# Patient Record
Sex: Female | Born: 1995 | Hispanic: No | Marital: Single | Smoking: Never smoker
Health system: Southern US, Community
[De-identification: ages and names within clinical notes are randomized; demographics above are authoritative.]

---
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
## 2019-02-05 ENCOUNTER — Other Ambulatory Visit: Payer: Self-pay

## 2019-02-05 DIAGNOSIS — Z20822 Contact with and (suspected) exposure to covid-19: Secondary | ICD-10-CM

## 2019-02-06 LAB — NOVEL CORONAVIRUS, NAA: SARS-CoV-2, NAA: NOT DETECTED

## 2019-02-12 ENCOUNTER — Ambulatory Visit: Payer: HRSA Program | Attending: Internal Medicine

## 2019-02-12 DIAGNOSIS — Z20822 Contact with and (suspected) exposure to covid-19: Secondary | ICD-10-CM

## 2019-02-12 DIAGNOSIS — Z20828 Contact with and (suspected) exposure to other viral communicable diseases: Secondary | ICD-10-CM | POA: Insufficient documentation

## 2019-02-14 LAB — NOVEL CORONAVIRUS, NAA: SARS-CoV-2, NAA: NOT DETECTED

## 2019-02-17 ENCOUNTER — Other Ambulatory Visit: Payer: Self-pay

## 2019-12-13 ENCOUNTER — Ambulatory Visit
Admission: RE | Admit: 2019-12-13 | Discharge: 2019-12-13 | Disposition: A | Discharge status: Home / Self Care | Payer: Managed Care, Other (non HMO) | Source: Ambulatory Visit | Attending: Family Medicine | Admitting: Family Medicine

## 2019-12-13 ENCOUNTER — Other Ambulatory Visit: Payer: Self-pay | Admitting: Family Medicine

## 2019-12-13 ENCOUNTER — Other Ambulatory Visit: Payer: Self-pay

## 2019-12-13 DIAGNOSIS — M7989 Other specified soft tissue disorders: Secondary | ICD-10-CM | POA: Diagnosis not present

## 2020-04-14 ENCOUNTER — Other Ambulatory Visit: Payer: Self-pay | Admitting: Physician Assistant

## 2020-04-14 DIAGNOSIS — R102 Pelvic and perineal pain: Secondary | ICD-10-CM

## 2020-04-21 ENCOUNTER — Other Ambulatory Visit: Payer: Self-pay

## 2020-04-21 ENCOUNTER — Ambulatory Visit: Payer: Managed Care, Other (non HMO)

## 2020-04-21 ENCOUNTER — Ambulatory Visit
Admission: RE | Admit: 2020-04-21 | Discharge: 2020-04-21 | Disposition: A | Discharge status: Home / Self Care | Payer: Managed Care, Other (non HMO) | Source: Ambulatory Visit | Attending: Physician Assistant | Admitting: Physician Assistant

## 2020-04-21 DIAGNOSIS — R102 Pelvic and perineal pain: Secondary | ICD-10-CM | POA: Insufficient documentation

## 2021-02-15 ENCOUNTER — Other Ambulatory Visit: Payer: Self-pay

## 2021-02-15 DIAGNOSIS — Z021 Encounter for pre-employment examination: Secondary | ICD-10-CM

## 2021-02-15 NOTE — Progress Notes (Signed)
Presents to [employer] for onsite pre-employment drug screen.  [facility] Acct #:  [id number] [facility] Specimen #:  [id number]  Rapid Drug Screen Results = Negative  AMD

## 2021-06-07 ENCOUNTER — Ambulatory Visit: Payer: Self-pay | Admitting: Physician Assistant

## 2021-06-07 ENCOUNTER — Encounter: Payer: Self-pay | Admitting: Physician Assistant

## 2021-06-07 VITALS — BP 111/69 | HR 88 | Temp 98.7°F | Resp 14 | Ht 59.0 in | Wt 125.0 lb

## 2021-06-07 DIAGNOSIS — L089 Local infection of the skin and subcutaneous tissue, unspecified: Secondary | ICD-10-CM

## 2021-06-07 MED ORDER — SULFAMETHOXAZOLE-TRIMETHOPRIM 800-160 MG PO TABS: 1.0000 | ORAL_TABLET | Freq: Two times a day (BID) | ORAL | 0 refills | Status: DC

## 2021-06-07 MED ORDER — NAPROXEN 500 MG PO TABS: 500.0000 mg | ORAL_TABLET | Freq: Two times a day (BID) | ORAL | Status: DC

## 2021-06-07 NOTE — Progress Notes (Signed)
Pt cut Left index finger with glass Saturday 06-02-21. Pt concerned of swelling and if she hits her finger she gets a numbing/tingling feeling. ?

## 2021-06-07 NOTE — Progress Notes (Signed)
? ?  Subjective: Left index finger infection  ? ? Patient ID: Pamela Ramos, female    DOB: 05-05-1995, 26 y.o.   MRN: [id number] ? ?HPI ?Patient presents for laceration status post glass cut on 06/02/2021.  Patient states she is cleaning the area with hydroperoxide and applying over-the-counter antibacterial ointments.  Patient states finger continues to have swollen and redness.  Denies loss sensation or loss of function. ? ? ?Review of Systems ?Negative except for chief complaint ?   ?Objective:  ? Physical Exam ? ?Temperature is 98.7, respiration 14, pulse 88, BP 111/69, and patient 1% O2 sat on room air.  Patient weighs 125 pounds and BMI is 25.25.  Examination of the left index finger reveals laceration at the MPJ palmar aspect of left hand.  Mild edema and erythema.  No drainage.  Full and equal range of motion.  Neurovascular intact. ? ? ?   ?Assessment & Plan: Infected finger  ? ?Patient given discharge care instructions and prescription for Bactrim DS and naproxen.  Patient advised to discontinue hydrogen peroxide and use warm soap and water for cleaning.  Advised to keep wound bandaged at work but exposed to air prior to going to bed.  Follow-up 1 week if no improvement. ?

## 2021-06-10 ENCOUNTER — Encounter: Payer: Self-pay | Admitting: Physician Assistant

## 2021-06-11 ENCOUNTER — Encounter: Payer: Self-pay | Admitting: Physician Assistant

## 2021-06-11 ENCOUNTER — Ambulatory Visit: Payer: Self-pay | Admitting: Physician Assistant

## 2021-06-11 VITALS — BP 106/60 | HR 86 | Temp 98.4°F | Resp 16 | Wt 136.0 lb

## 2021-06-11 DIAGNOSIS — L231 Allergic contact dermatitis due to adhesives: Secondary | ICD-10-CM

## 2021-06-11 MED ORDER — HYDROXYZINE PAMOATE 25 MG PO CAPS: 25.0000 mg | ORAL_CAPSULE | Freq: Three times a day (TID) | ORAL | 0 refills | Status: DC | PRN

## 2021-06-11 MED ORDER — HYDROCORTISONE VALERATE 0.2 % EX CREA: TOPICAL_CREAM | Freq: Two times a day (BID) | CUTANEOUS | Status: DC

## 2021-06-11 NOTE — Progress Notes (Signed)
? ?  Subjective: Rash  ? ? Patient ID: Pamela Ramos, female    DOB: 03-21-1995, 26 y.o.   MRN: [id number] ? ?HPI ?Patient presents for rash on the right index finger.  Patient previously treated for infected glass cut of the affected finger x1 adhesive dressing site radiating to the distal finger.  Associated mild itching.  Patient states she keeps the finger bandage because she works around morning and did not want to give further infection. ? ? ?Review of Systems ?Negative except for complaint ?   ?Objective:  ? Physical Exam ? ?Temperature is 98.4, respiration 16, pulse 86, BP is 106/60, and patient 1% O2 sat on room air.  Patient was 136 pounds and BMI is 27.47. ?Examination of the finger shows dorsal laceration healed by granulation.  Vesicle lesions are on the dorsal aspect where the adhesive bandage is applied.  No rash on the laceration site.  Neurovascular intact with free Nikkel range of motion.  No signs of infection. ? ? ?   ?Assessment & Plan: Contact dermatitis  ?Patient advised to discontinue use of adhesive bandages.  Advised to wear a glove until healing is complete.  Patient given a prescription for Westcort and Atarax.  Follow-up if no improvement or worsening complaint. ? ?

## 2021-06-11 NOTE — Progress Notes (Signed)
Laceration to finger - was seen in the clinic last Thursday & prescribed ABX ? ?Thinks she's allergic to the ABX because she's getting bumps on her hands & arms. ?D/C'd the ABX - Took Med Thurs, Fri, Sat & Sun - Stopped it this morning. ? ?AMD ? ?

## 2021-06-29 DIAGNOSIS — Z1389 Encounter for screening for other disorder: Secondary | ICD-10-CM | POA: Diagnosis not present

## 2021-06-29 DIAGNOSIS — Z3200 Encounter for pregnancy test, result unknown: Secondary | ICD-10-CM | POA: Diagnosis not present

## 2021-06-29 DIAGNOSIS — R102 Pelvic and perineal pain: Secondary | ICD-10-CM | POA: Diagnosis not present

## 2021-07-11 DIAGNOSIS — Z3149 Encounter for other procreative investigation and testing: Secondary | ICD-10-CM | POA: Diagnosis not present

## 2021-07-16 ENCOUNTER — Other Ambulatory Visit: Payer: Self-pay | Admitting: Obstetrics and Gynecology

## 2021-07-16 DIAGNOSIS — Z3149 Encounter for other procreative investigation and testing: Secondary | ICD-10-CM

## 2021-07-20 ENCOUNTER — Encounter: Payer: Self-pay | Admitting: Obstetrics and Gynecology

## 2021-07-20 ENCOUNTER — Ambulatory Visit
Admission: RE | Admit: 2021-07-20 | Discharge: 2021-07-20 | Disposition: A | Discharge status: Home / Self Care | Payer: 59 | Source: Ambulatory Visit | Attending: Obstetrics and Gynecology | Admitting: Obstetrics and Gynecology

## 2021-07-20 DIAGNOSIS — N979 Female infertility, unspecified: Secondary | ICD-10-CM | POA: Diagnosis not present

## 2021-07-20 DIAGNOSIS — Z3149 Encounter for other procreative investigation and testing: Secondary | ICD-10-CM | POA: Diagnosis not present

## 2021-07-20 DIAGNOSIS — Z01812 Encounter for preprocedural laboratory examination: Secondary | ICD-10-CM | POA: Diagnosis not present

## 2021-07-20 MED ORDER — IOHEXOL 300 MG/ML  SOLN
20.0000 mL | Freq: Once | INTRAMUSCULAR | Status: AC | PRN
  Administered 2021-07-20: 5 mL

## 2021-07-20 NOTE — Progress Notes (Signed)
[id number]  25-Mar-1995 26 y.o. 07/20/21  Benjaman Kindler, MD  Hysterosalpingogram Procedure Note  Date of procedure: 07/20/2021   Pre-operative Diagnosis: Infertility  Post-operative Diagnosis: same, patent tubes  Procedure: Hysterosalpingogram  Surgeon: Angelina Pih, MD  Assistant(s):  Radiology assistant. The radiologist present for today read the imaging and agreed with findings below.  Anesthesia: None  Estimated Blood Loss:  None         Complications:  None; patient tolerated the procedure well.         Disposition: To home         Condition: stable  Findings: Bilateral fill and spill of the tubes and a normal endometrial contour was noted.  Procedure Details  HSG procedure discussed with the patient.  Risks, complications, alternatives have been reviewed with her and she agrees to proceed.   The patient presented to the radiology lab and was identified as the correct patient and the procedure verified as an HSG. A verbal Time Out was held with all team members present and in agreement.  Speculum was inserted in to the vagina and the cervix visualized.  The cervix was cleaned with betadine solution. The HSG catheter was inserted and the balloon insufflated with approximately 1.5 ml of air.  Patient was then repositioned for fluoroscopy.  A total of 10 ml of contrast was used for the procedure. The patient tolerated the procedure well, no complications.   Bilateral fill and spill of the tubes and a normal endometrial contour was noted.  Results were reviewed with the patient at the time of the procedure. She verbalized understanding.   Benjaman Kindler, MD 07/20/2021

## 2021-08-02 DIAGNOSIS — Z3149 Encounter for other procreative investigation and testing: Secondary | ICD-10-CM | POA: Diagnosis not present

## 2021-09-14 DIAGNOSIS — Z1389 Encounter for screening for other disorder: Secondary | ICD-10-CM | POA: Diagnosis not present

## 2021-09-14 DIAGNOSIS — M940 Chondrocostal junction syndrome [Tietze]: Secondary | ICD-10-CM | POA: Diagnosis not present

## 2021-09-14 DIAGNOSIS — Z1331 Encounter for screening for depression: Secondary | ICD-10-CM | POA: Diagnosis not present

## 2021-10-03 DIAGNOSIS — Z1389 Encounter for screening for other disorder: Secondary | ICD-10-CM | POA: Diagnosis not present

## 2021-10-03 DIAGNOSIS — Z3201 Encounter for pregnancy test, result positive: Secondary | ICD-10-CM | POA: Diagnosis not present

## 2021-10-03 DIAGNOSIS — Z3200 Encounter for pregnancy test, result unknown: Secondary | ICD-10-CM | POA: Diagnosis not present

## 2021-10-08 DIAGNOSIS — Z1389 Encounter for screening for other disorder: Secondary | ICD-10-CM | POA: Diagnosis not present

## 2021-10-08 DIAGNOSIS — N390 Urinary tract infection, site not specified: Secondary | ICD-10-CM | POA: Diagnosis not present

## 2021-10-09 DIAGNOSIS — N912 Amenorrhea, unspecified: Secondary | ICD-10-CM | POA: Diagnosis not present

## 2021-11-06 DIAGNOSIS — Z3490 Encounter for supervision of normal pregnancy, unspecified, unspecified trimester: Secondary | ICD-10-CM | POA: Insufficient documentation

## 2021-11-07 DIAGNOSIS — Z3201 Encounter for pregnancy test, result positive: Secondary | ICD-10-CM | POA: Diagnosis not present

## 2021-11-07 DIAGNOSIS — R102 Pelvic and perineal pain: Secondary | ICD-10-CM | POA: Diagnosis not present

## 2021-11-07 DIAGNOSIS — Z1389 Encounter for screening for other disorder: Secondary | ICD-10-CM | POA: Diagnosis not present

## 2021-11-09 DIAGNOSIS — Z3481 Encounter for supervision of other normal pregnancy, first trimester: Secondary | ICD-10-CM | POA: Diagnosis not present

## 2021-11-09 LAB — OB RESULTS CONSOLE HEPATITIS B SURFACE ANTIGEN: Hepatitis B Surface Ag: NEGATIVE

## 2021-11-09 LAB — OB RESULTS CONSOLE RPR: RPR: NONREACTIVE

## 2021-11-09 LAB — OB RESULTS CONSOLE GC/CHLAMYDIA
Chlamydia: NEGATIVE
Neisseria Gonorrhea: NEGATIVE

## 2021-11-09 LAB — OB RESULTS CONSOLE HIV ANTIBODY (ROUTINE TESTING): HIV: NONREACTIVE

## 2021-11-09 LAB — OB RESULTS CONSOLE RUBELLA ANTIBODY, IGM: Rubella: IMMUNE

## 2021-11-09 LAB — HEPATITIS C ANTIBODY: HCV Ab: NEGATIVE

## 2021-11-09 LAB — OB RESULTS CONSOLE VARICELLA ZOSTER ANTIBODY, IGG: Varicella: IMMUNE

## 2021-11-09 LAB — OB RESULTS CONSOLE ABO/RH: RH Type: POSITIVE

## 2021-12-11 DIAGNOSIS — K5900 Constipation, unspecified: Secondary | ICD-10-CM | POA: Diagnosis not present

## 2021-12-25 DIAGNOSIS — R350 Frequency of micturition: Secondary | ICD-10-CM | POA: Diagnosis not present

## 2022-01-11 DIAGNOSIS — Z3482 Encounter for supervision of other normal pregnancy, second trimester: Secondary | ICD-10-CM | POA: Diagnosis not present

## 2022-03-04 ENCOUNTER — Encounter: Payer: Self-pay | Admitting: Obstetrics and Gynecology

## 2022-03-04 ENCOUNTER — Other Ambulatory Visit: Payer: Self-pay

## 2022-03-04 ENCOUNTER — Observation Stay
Admission: EM | Admit: 2022-03-04 | Discharge: 2022-03-04 | Disposition: A | Discharge status: Home / Self Care | Payer: 59 | Attending: Obstetrics and Gynecology | Admitting: Obstetrics and Gynecology

## 2022-03-04 DIAGNOSIS — Z79899 Other long term (current) drug therapy: Secondary | ICD-10-CM | POA: Diagnosis not present

## 2022-03-04 DIAGNOSIS — O99891 Other specified diseases and conditions complicating pregnancy: Secondary | ICD-10-CM | POA: Diagnosis not present

## 2022-03-04 DIAGNOSIS — R109 Unspecified abdominal pain: Secondary | ICD-10-CM | POA: Diagnosis not present

## 2022-03-04 DIAGNOSIS — O26899 Other specified pregnancy related conditions, unspecified trimester: Secondary | ICD-10-CM | POA: Diagnosis present

## 2022-03-04 DIAGNOSIS — Z3A29 29 weeks gestation of pregnancy: Secondary | ICD-10-CM | POA: Insufficient documentation

## 2022-03-04 DIAGNOSIS — O26893 Other specified pregnancy related conditions, third trimester: Principal | ICD-10-CM | POA: Insufficient documentation

## 2022-03-04 LAB — URINALYSIS, COMPLETE (UACMP) WITH MICROSCOPIC
Bilirubin Urine: NEGATIVE
Glucose, UA: NEGATIVE mg/dL
Hgb urine dipstick: NEGATIVE
Ketones, ur: NEGATIVE mg/dL
Leukocytes,Ua: NEGATIVE
Nitrite: NEGATIVE
Protein, ur: NEGATIVE mg/dL
Specific Gravity, Urine: 1.003 — ABNORMAL LOW (ref 1.005–1.030)
pH: 7 (ref 5.0–8.0)

## 2022-03-04 MED ORDER — CALCIUM CARBONATE ANTACID 500 MG PO CHEW: 2.0000 | CHEWABLE_TABLET | ORAL | Status: DC | PRN

## 2022-03-04 MED ORDER — ACETAMINOPHEN 500 MG PO TABS
1000.0000 mg | ORAL_TABLET | Freq: Four times a day (QID) | ORAL | Status: DC | PRN
Start: 2022-03-04 — End: 2022-03-04

## 2022-03-04 NOTE — Progress Notes (Signed)
Discharge instructions provided to patient. Patient verbalized understanding. Pt educated on signs and symptoms of labor, vaginal bleeding, LOF, and fetal movement. Red flag signs reviewed by RN. Patient discharged home with significant other in stable condition.  
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
## 2022-03-07 DIAGNOSIS — Z3482 Encounter for supervision of other normal pregnancy, second trimester: Secondary | ICD-10-CM | POA: Diagnosis not present

## 2022-04-15 DIAGNOSIS — O368130 Decreased fetal movements, third trimester, not applicable or unspecified: Secondary | ICD-10-CM | POA: Diagnosis not present

## 2022-04-22 DIAGNOSIS — Z3403 Encounter for supervision of normal first pregnancy, third trimester: Secondary | ICD-10-CM | POA: Diagnosis not present

## 2022-04-22 DIAGNOSIS — Z114 Encounter for screening for human immunodeficiency virus [HIV]: Secondary | ICD-10-CM | POA: Diagnosis not present

## 2022-04-30 DIAGNOSIS — Z0000 Encounter for general adult medical examination without abnormal findings: Secondary | ICD-10-CM | POA: Diagnosis not present

## 2022-05-08 DIAGNOSIS — O169 Unspecified maternal hypertension, unspecified trimester: Secondary | ICD-10-CM | POA: Diagnosis not present

## 2022-05-09 ENCOUNTER — Other Ambulatory Visit: Payer: Self-pay

## 2022-05-09 ENCOUNTER — Encounter: Payer: Self-pay | Admitting: Obstetrics and Gynecology

## 2022-05-09 ENCOUNTER — Inpatient Hospital Stay
Admission: EM | Admit: 2022-05-09 | Discharge: 2022-05-11 | DRG: 788 | Disposition: A | Discharge status: Home / Self Care | Payer: 59

## 2022-05-09 ENCOUNTER — Encounter: Admission: EM | Disposition: A | Discharge status: Home / Self Care | Payer: Self-pay | Source: Home / Self Care

## 2022-05-09 ENCOUNTER — Inpatient Hospital Stay: Payer: 59 | Admitting: Anesthesiology

## 2022-05-09 DIAGNOSIS — O339 Maternal care for disproportion, unspecified: Secondary | ICD-10-CM | POA: Diagnosis present

## 2022-05-09 DIAGNOSIS — O9081 Anemia of the puerperium: Secondary | ICD-10-CM | POA: Diagnosis not present

## 2022-05-09 DIAGNOSIS — O4292 Full-term premature rupture of membranes, unspecified as to length of time between rupture and onset of labor: Secondary | ICD-10-CM | POA: Diagnosis not present

## 2022-05-09 DIAGNOSIS — O324XX0 Maternal care for high head at term, not applicable or unspecified: Secondary | ICD-10-CM | POA: Diagnosis not present

## 2022-05-09 DIAGNOSIS — O1405 Mild to moderate pre-eclampsia, complicating the puerperium: Secondary | ICD-10-CM | POA: Diagnosis not present

## 2022-05-09 DIAGNOSIS — G8918 Other acute postprocedural pain: Secondary | ICD-10-CM | POA: Diagnosis not present

## 2022-05-09 DIAGNOSIS — Z3A38 38 weeks gestation of pregnancy: Secondary | ICD-10-CM | POA: Diagnosis not present

## 2022-05-09 DIAGNOSIS — O99214 Obesity complicating childbirth: Secondary | ICD-10-CM | POA: Diagnosis present

## 2022-05-09 DIAGNOSIS — O26893 Other specified pregnancy related conditions, third trimester: Secondary | ICD-10-CM | POA: Diagnosis present

## 2022-05-09 DIAGNOSIS — O1404 Mild to moderate pre-eclampsia, complicating childbirth: Secondary | ICD-10-CM | POA: Diagnosis not present

## 2022-05-09 DIAGNOSIS — O1490 Unspecified pre-eclampsia, unspecified trimester: Secondary | ICD-10-CM | POA: Diagnosis present

## 2022-05-09 DIAGNOSIS — O4290 Premature rupture of membranes, unspecified as to length of time between rupture and onset of labor, unspecified weeks of gestation: Secondary | ICD-10-CM | POA: Diagnosis present

## 2022-05-09 LAB — CBC
HCT: 36.9 % (ref 36.0–46.0)
Hemoglobin: 12.2 g/dL (ref 12.0–15.0)
MCH: 29.4 pg (ref 26.0–34.0)
MCHC: 33.1 g/dL (ref 30.0–36.0)
MCV: 88.9 fL (ref 80.0–100.0)
Platelets: 196 10*3/uL (ref 150–400)
RBC: 4.15 MIL/uL (ref 3.87–5.11)
RDW: 13.2 % (ref 11.5–15.5)
WBC: 8.1 10*3/uL (ref 4.0–10.5)
nRBC: 0 % (ref 0.0–0.2)

## 2022-05-09 LAB — TYPE AND SCREEN
ABO/RH(D): O POS
Antibody Screen: NEGATIVE

## 2022-05-09 LAB — ABO/RH: ABO/RH(D): O POS

## 2022-05-09 LAB — RUPTURE OF MEMBRANE (ROM)PLUS: Rom Plus: POSITIVE

## 2022-05-09 LAB — RPR: RPR Ser Ql: NONREACTIVE

## 2022-05-09 SURGERY — Surgical Case
Anesthesia: Spinal

## 2022-05-09 MED ORDER — MISOPROSTOL 200 MCG PO TABS
ORAL_TABLET | ORAL | Status: AC
  Filled 2022-05-09: qty 4

## 2022-05-09 MED ORDER — CEFAZOLIN SODIUM-DEXTROSE 2-4 GM/100ML-% IV SOLN
INTRAVENOUS | Status: AC
  Filled 2022-05-09: qty 100

## 2022-05-09 MED ORDER — ONDANSETRON HCL 4 MG/2ML IJ SOLN: 4.0000 mg | Freq: Four times a day (QID) | INTRAMUSCULAR | Status: DC | PRN

## 2022-05-09 MED ORDER — AMMONIA AROMATIC IN INHA
RESPIRATORY_TRACT | Status: AC
  Filled 2022-05-09: qty 10

## 2022-05-09 MED ORDER — OXYTOCIN-SODIUM CHLORIDE 30-0.9 UT/500ML-% IV SOLN
2.5000 [IU]/h | INTRAVENOUS | Status: DC
  Administered 2022-05-09: 30 [IU] via INTRAVENOUS
  Filled 2022-05-09 (×3): qty 500

## 2022-05-09 MED ORDER — FENTANYL CITRATE (PF) 100 MCG/2ML IJ SOLN
50.0000 ug | INTRAMUSCULAR | Status: DC | PRN
  Administered 2022-05-09: 100 ug via INTRAVENOUS
  Filled 2022-05-09: qty 2

## 2022-05-09 MED ORDER — LIDOCAINE HCL (PF) 1 % IJ SOLN
INTRAMUSCULAR | Status: AC
  Filled 2022-05-09: qty 30

## 2022-05-09 MED ORDER — NIFEDIPINE ER OSMOTIC RELEASE 30 MG PO TB24
30.0000 mg | ORAL_TABLET | Freq: Every day | ORAL | Status: DC
  Administered 2022-05-10: 30 mg via ORAL
  Filled 2022-05-09: qty 1

## 2022-05-09 MED ORDER — TERBUTALINE SULFATE 1 MG/ML IJ SOLN: 0.2500 mg | Freq: Once | INTRAMUSCULAR | Status: DC | PRN

## 2022-05-09 MED ORDER — 0.9 % SODIUM CHLORIDE (POUR BTL) OPTIME
TOPICAL | Status: DC | PRN
  Administered 2022-05-09: 1000 mL

## 2022-05-09 MED ORDER — CEFAZOLIN SODIUM-DEXTROSE 2-4 GM/100ML-% IV SOLN
2.0000 g | Freq: Once | INTRAVENOUS | Status: AC
  Administered 2022-05-09: 2 g via INTRAVENOUS

## 2022-05-09 MED ORDER — GABAPENTIN 300 MG PO CAPS
300.0000 mg | ORAL_CAPSULE | Freq: Once | ORAL | Status: AC
  Administered 2022-05-09: 300 mg via ORAL

## 2022-05-09 MED ORDER — ONDANSETRON HCL 4 MG/2ML IJ SOLN
INTRAMUSCULAR | Status: DC | PRN
  Administered 2022-05-09: 4 mg via INTRAVENOUS

## 2022-05-09 MED ORDER — BUPIVACAINE HCL 0.25 % IJ SOLN
INTRAMUSCULAR | Status: DC | PRN
  Administered 2022-05-09: 60 mL

## 2022-05-09 MED ORDER — SOD CITRATE-CITRIC ACID 500-334 MG/5ML PO SOLN
ORAL | Status: AC
  Filled 2022-05-09: qty 15

## 2022-05-09 MED ORDER — DEXAMETHASONE SODIUM PHOSPHATE 10 MG/ML IJ SOLN
INTRAMUSCULAR | Status: DC | PRN
  Administered 2022-05-09: 10 mg via INTRAVENOUS

## 2022-05-09 MED ORDER — FENTANYL CITRATE (PF) 100 MCG/2ML IJ SOLN
INTRAMUSCULAR | Status: AC
  Filled 2022-05-09: qty 2

## 2022-05-09 MED ORDER — SODIUM CHLORIDE 0.9% FLUSH
INTRAVENOUS | Status: DC | PRN
  Administered 2022-05-09: 20 mL

## 2022-05-09 MED ORDER — ONDANSETRON HCL 4 MG/2ML IJ SOLN
INTRAMUSCULAR | Status: AC
  Filled 2022-05-09: qty 2

## 2022-05-09 MED ORDER — MISOPROSTOL 25 MCG QUARTER TABLET
25.0000 ug | ORAL_TABLET | ORAL | Status: DC | PRN
  Administered 2022-05-09: 25 ug via ORAL
  Filled 2022-05-09: qty 1

## 2022-05-09 MED ORDER — PHENYLEPHRINE HCL-NACL 20-0.9 MG/250ML-% IV SOLN
INTRAVENOUS | Status: AC
  Filled 2022-05-09: qty 250

## 2022-05-09 MED ORDER — MORPHINE SULFATE (PF) 0.5 MG/ML IJ SOLN
INTRAMUSCULAR | Status: AC
  Filled 2022-05-09: qty 10

## 2022-05-09 MED ORDER — OXYTOCIN BOLUS FROM INFUSION: 333.0000 mL | Freq: Once | INTRAVENOUS | Status: DC

## 2022-05-09 MED ORDER — LACTATED RINGERS IV SOLN
500.0000 mL | INTRAVENOUS | Status: DC | PRN
  Administered 2022-05-09: 500 mL via INTRAVENOUS
  Administered 2022-05-09: 250 mL via INTRAVENOUS

## 2022-05-09 MED ORDER — FENTANYL CITRATE (PF) 100 MCG/2ML IJ SOLN
INTRAMUSCULAR | Status: DC | PRN
  Administered 2022-05-09: 15 ug via INTRATHECAL

## 2022-05-09 MED ORDER — LABETALOL HCL 5 MG/ML IV SOLN
INTRAVENOUS | Status: AC
  Filled 2022-05-09: qty 4

## 2022-05-09 MED ORDER — ACETAMINOPHEN 500 MG PO TABS
1000.0000 mg | ORAL_TABLET | Freq: Once | ORAL | Status: AC
  Administered 2022-05-09: 1000 mg via ORAL

## 2022-05-09 MED ORDER — BUPIVACAINE IN DEXTROSE 0.75-8.25 % IT SOLN
INTRATHECAL | Status: DC | PRN
  Administered 2022-05-09: 1.3 mL via INTRATHECAL

## 2022-05-09 MED ORDER — SOD CITRATE-CITRIC ACID 500-334 MG/5ML PO SOLN
ORAL | Status: AC
  Administered 2022-05-09: 30 mL via ORAL
  Filled 2022-05-09: qty 15

## 2022-05-09 MED ORDER — DIPHENHYDRAMINE HCL 50 MG/ML IJ SOLN
25.0000 mg | Freq: Once | INTRAMUSCULAR | Status: AC
  Administered 2022-05-09: 25 mg via INTRAVENOUS
  Filled 2022-05-09: qty 1

## 2022-05-09 MED ORDER — OXYTOCIN-SODIUM CHLORIDE 30-0.9 UT/500ML-% IV SOLN
1.0000 m[IU]/min | INTRAVENOUS | Status: DC
  Administered 2022-05-09: 2 m[IU]/min via INTRAVENOUS

## 2022-05-09 MED ORDER — PHENYLEPHRINE HCL (PRESSORS) 10 MG/ML IV SOLN
INTRAVENOUS | Status: DC | PRN
  Administered 2022-05-09 (×2): 80 ug via INTRAVENOUS

## 2022-05-09 MED ORDER — ACETAMINOPHEN 325 MG PO TABS: 650.0000 mg | ORAL_TABLET | ORAL | Status: DC | PRN

## 2022-05-09 MED ORDER — SOD CITRATE-CITRIC ACID 500-334 MG/5ML PO SOLN: 30.0000 mL | ORAL | Status: DC | PRN

## 2022-05-09 MED ORDER — PHENYLEPHRINE HCL-NACL 20-0.9 MG/250ML-% IV SOLN
INTRAVENOUS | Status: DC | PRN
  Administered 2022-05-09: 50 ug/min via INTRAVENOUS

## 2022-05-09 MED ORDER — LACTATED RINGERS IV SOLN: INTRAVENOUS | Status: DC

## 2022-05-09 MED ORDER — DEXAMETHASONE SODIUM PHOSPHATE 10 MG/ML IJ SOLN
INTRAMUSCULAR | Status: AC
  Filled 2022-05-09: qty 1

## 2022-05-09 MED ORDER — LIDOCAINE HCL (PF) 1 % IJ SOLN: 30.0000 mL | INTRAMUSCULAR | Status: DC | PRN

## 2022-05-09 MED ORDER — MORPHINE SULFATE (PF) 0.5 MG/ML IJ SOLN
INTRAMUSCULAR | Status: DC | PRN
  Administered 2022-05-09: .1 mg via INTRATHECAL

## 2022-05-09 MED ORDER — SODIUM CHLORIDE 0.9 % IV SOLN
500.0000 mg | INTRAVENOUS | Status: DC
  Administered 2022-05-09: 500 mg via INTRAVENOUS

## 2022-05-09 MED ORDER — OXYTOCIN 10 UNIT/ML IJ SOLN
INTRAMUSCULAR | Status: AC
  Filled 2022-05-09: qty 2

## 2022-05-09 SURGICAL SUPPLY — 29 items
BARRIER ADHS 3X4 INTERCEED (GAUZE/BANDAGES/DRESSINGS) ×1 IMPLANT
CHLORAPREP W/TINT 26 (MISCELLANEOUS) ×1 IMPLANT
DRSG TELFA 3X8 NADH STRL (GAUZE/BANDAGES/DRESSINGS) ×1 IMPLANT
ELECT CAUTERY BLADE 6.4 (BLADE) ×1 IMPLANT
ELECT REM PT RETURN 9FT ADLT (ELECTROSURGICAL) ×1
ELECTRODE REM PT RTRN 9FT ADLT (ELECTROSURGICAL) ×1 IMPLANT
GAUZE SPONGE 4X4 12PLY STRL (GAUZE/BANDAGES/DRESSINGS) ×1 IMPLANT
GLOVE SURG SYN 8.0 (GLOVE) ×1 IMPLANT
GLOVE SURG SYN 8.0 PF PI (GLOVE) ×1 IMPLANT
GOWN STRL REUS W/ TWL LRG LVL3 (GOWN DISPOSABLE) ×2 IMPLANT
GOWN STRL REUS W/ TWL XL LVL3 (GOWN DISPOSABLE) ×1 IMPLANT
GOWN STRL REUS W/TWL LRG LVL3 (GOWN DISPOSABLE) ×2
GOWN STRL REUS W/TWL XL LVL3 (GOWN DISPOSABLE) ×1
MANIFOLD NEPTUNE II (INSTRUMENTS) ×1 IMPLANT
MAT PREVALON FULL STRYKER (MISCELLANEOUS) ×1 IMPLANT
NEEDLE HYPO 22GX1.5 SAFETY (NEEDLE) ×1 IMPLANT
NS IRRIG 1000ML POUR BTL (IV SOLUTION) ×1 IMPLANT
PACK C SECTION AR (MISCELLANEOUS) ×1 IMPLANT
PAD OB MATERNITY 4.3X12.25 (PERSONAL CARE ITEMS) ×1 IMPLANT
PAD PREP 24X41 OB/GYN DISP (PERSONAL CARE ITEMS) ×1 IMPLANT
SCRUB CHG 4% DYNA-HEX 4OZ (MISCELLANEOUS) ×1 IMPLANT
STAPLER INSORB 30 2030 C-SECTI (MISCELLANEOUS) IMPLANT
STRAP SAFETY 5IN WIDE (MISCELLANEOUS) ×1 IMPLANT
SUT CHROMIC 1 CTX 36 (SUTURE) ×3 IMPLANT
SUT PLAIN GUT 0 (SUTURE) ×2 IMPLANT
SUT VIC AB 0 CT1 36 (SUTURE) ×2 IMPLANT
SYR 30ML LL (SYRINGE) ×2 IMPLANT
TRAP FLUID SMOKE EVACUATOR (MISCELLANEOUS) ×1 IMPLANT
WATER STERILE IRR 500ML POUR (IV SOLUTION) ×1 IMPLANT

## 2022-05-09 NOTE — Transfer of Care (Signed)
Immediate Anesthesia Transfer of Care Note  Patient: Pamela Ramos  Procedure(s) Performed: CESAREAN SECTION  Patient Location: PACU  Anesthesia Type:Spinal  Level of Consciousness: awake, alert , and oriented  Airway & Oxygen Therapy: Patient Spontanous Breathing  Post-op Assessment: Report given to RN and Post -op Geron signs reviewed and stable  Post Avakian signs: Reviewed  Last Vitals:  Vitals Value Taken Time  BP 116/93 05/09/22 2136  Temp 97.9   Pulse 88 05/09/22 2136  Resp 16 05/09/22 2136  SpO2 99 % 05/09/22 2136    Last Pain:  Vitals:   05/09/22 1630  TempSrc: Oral  PainSc:       Patients Stated Pain Goal: 0 ([id number])  Complications: No notable events documented.

## 2022-05-09 NOTE — OB Triage Note (Signed)
Patient is a G2P0010 at 38wk3d reporting her water breaking around 1am. Patient states she has noticed back pain since this occurring and has been leaking fluid since the first intial gush of fluid. Patient reports +FM, and denies vaginal bleeding. Patient is unsure if she feels ctx. Patient denies headache, blurry vision, or right epigastric pain. Initial FHT is 125bpm.

## 2022-05-09 NOTE — Progress Notes (Signed)
Labor Progress Note  Pamela Ramos is a 27 y.o. G2P0010 at 28w3dby LMP admitted for rupture of membranes at 0100 today.   Subjective: intermittent pelvic pressure, painful UCs, better after IVPM given.   Objective: BP 120/75 (BP Location: Right Arm)   Pulse 91   Temp 97.8 F (36.6 C) (Oral)   Resp 16   Ht '4\' 9"'$  (1.448 m)   Wt 78.5 kg   LMP 08/13/2021 (Exact Date)   BMI 37.45 kg/m  Notable VS details: reviewed Vitals:   05/09/22 0140 05/09/22 0629 05/09/22 0857 05/09/22 1336  BP: 135/81 108/85 126/79 (!) 133/90   05/09/22 1822  BP: 120/75     Fetal Assessment: FHT:  FHR: 135 bpm, variability: min-mod,  accelerations:  Present,  decelerations:  Present early/variable Category/reactivity:  Category II UC:   irregular, every 1.5-4 minutes; Pitocin at 669mmin; MVUs 210-235  SVE 2/90/-2; soft, anterior.   Membrane status: PROM at 0100 Amniotic color: clear  Labs: Lab Results  Component Value Date   WBC 8.1 05/09/2022   HGB 12.2 05/09/2022   HCT 36.9 05/09/2022   MCV 88.9 05/09/2022   PLT 196 05/09/2022    Assessment / Plan: G2P0010 at 3875w3dROM at 0100 Latent labor, augmentation  Labor: s/p 1 dose of cytotec, Pitocin titrating, with adequate MVUs x 8hours, cervix swelling resolved after IV Benadryl but no cervical change.  Preeclampsia:mild range BP noted, mild range BP x 1 yesterday in clinic. P/C ratio in office 383; Pre-eclampsia without severe features.  Fetal Wellbeing:  Category I Pain Control:  Labor support without medications; one dose of IVPM given.  I/D:  n/a; GBS Neg, ROM x 17hrs  RebFrancetta FoundNM 05/09/2022, 6:52 PM

## 2022-05-09 NOTE — Op Note (Signed)
NAME: Pamela Ramos, ABDELMALEK MEDICAL RECORD NO: [id number] ACCOUNT NO: [id number] DATE OF BIRTH: 1995/06/05 FACILITY: [facility] LOCATION: [facility] PHYSICIAN: Boykin Nearing, MD  Operative Report   DATE OF PROCEDURE: 05/09/2022   PREOPERATIVE DIAGNOSIS:   1.  38+3 weeks estimated gestational age. 2.  Cephalopelvic disproportion.  POSTOPERATIVE DIAGNOSIS:   1.  38+3 weeks estimated gestational age. 2.  Cephalopelvic disproportion. 3. Vigorous female, delivered.  PROCEDURE:  Primary low transverse cesarean section.  SURGEON:  Boykin Nearing, MD  FIRST ASSISTANT:  Hassan Buckler, certified nurse midwife.  ANESTHESIA:  Spinal.  INDICATIONS:  A 27 year old gravida 1, para 0, patient with rupture of membranes and laboring with adequate contractions for 9+ hours. Patient's cervix did not progress past 2 cm and pelvic exam revealed a constricted pelvis with narrow arch and fetal  head not engaged.  DESCRIPTION OF PROCEDURE:  After adequate spinal anesthesia, the patient was placed in dorsal supine position, hip roll onto the right side.  The patient received 2 grams of IV Ancef and 500 mg azithromycin for surgical prophylaxis.  Timeout was  performed.  A Pfannenstiel incision was made 2 fingerbreadths above the symphysis pubis.  Sharp dissection was used to identify the fascia.  Fascia was opened in the midline and opened in transverse fashion.  Superior aspect of the fascia was grasped  with Kocher clamps and the recti muscles were dissected free.  Inferior aspect of fascia was grasped with Kocher clamps and pyramidalis muscle was dissected free.  Entry into the peritoneal cavity was accomplished sharply.  Vesicouterine peritoneal fold  was identified and bladder flap was created and bladder was reflected inferiorly.  Low transverse uterine incision was made.  Upon entry into the endometrial cavity, clear fluid resulted.  A wedged fetal head was then elevated to the incision with  fundal  pressure, the head, shoulders and body were delivered without difficulty.  Vigorous female was then dried on mother's abdomen for 60 seconds and cord was doubly clamped and vigorous female was passed to nursery staff who assigned Apgar scores of 9 and  9.  The placenta was manually delivered and the uterus was exteriorized and the endometrial cavity was wiped clean with laparotomy tape.  Uterine incision was closed with 1 chromic suture in a running locking fashion.  One additional figure-of-eight  suture required for hemostasis.  Fallopian tubes and ovaries appeared normal.  Posterior cul-de-sac was irrigated and suctioned and the uterus was placed back into the abdominal cavity and the paracolic gutters were wiped clean with laparotomy tape.   Uterine incision again appeared hemostatic.  Interceed was placed over the uterine incision in T-shape fashion.  Fascia was then closed with 0 Vicryl suture in a running nonlocking fashion, good approximation of edges.  Fascial edges were injected with a  solution of 0.25% Marcaine 60 mL plus 20 mL normal saline and approximately 30 mL was injected in the fascial edges.  Subcutaneous tissues were irrigated and bovied for hemostasis and the skin was reapproximated with Insorb absorbable staples.  Good  cosmetic effect.  Additional 30 mL of Marcaine solution was injected beneath the skin.  There were no complications.  QUANTITATIVE BLOOD LOSS:  585 mL  INTRAOPERATIVE FLUIDS:  1000 mL  URINE OUTPUT:  300 mL. The patient tolerated the procedure well and was taken to recovery room in good condition.     SUJ D: 05/09/2022 9:37:19 pm T: 05/09/2022 9:53:00 pm  JOB: [id number]

## 2022-05-09 NOTE — H&P (Addendum)
OB History & Physical   History of Present Illness:   Chief Complaint: leaking of fluid  HPI:  Pamela Ramos is a 27 y.o. G2P0010 female at [redacted]w[redacted]d Patient's last menstrual period was 08/13/2021 (exact date)., consistent with [place] 113w4dwith Estimated Date of Delivery: 05/20/22.  She presents to L&D for SROM  Reports active fetal movement  Contractions: irregular cramping  LOF/SROM: 0100 clear fluid Vaginal bleeding: denies  Factors complicating pregnancy:  Normal pregnancy  Patient Active Problem List   Diagnosis Date Noted   Leakage of amniotic fluid 05/09/2022   Abdominal pain affecting pregnancy 03/04/2022   Encounter for supervision of normal pregnancy 11/06/2021    Prenatal Transfer Tool  Maternal Diabetes: No Genetic Screening: Normal Maternal Ultrasounds/Referrals: Normal Fetal Ultrasounds or other Referrals:  None Maternal Substance Abuse:  No Significant Maternal Medications:  None Significant Maternal Lab Results: Group B Strep negative  Maternal Medical History:  History reviewed. No pertinent past medical history.  History reviewed. No pertinent surgical history.  No Known Allergies  Prior to Admission medications   Medication Sig Start Date End Date Taking? Authorizing Provider  Prenatal Vit-Fe Fumarate-FA (PRENATAL MULTIVITAMIN) TABS tablet Take 1 tablet by mouth daily at 12 noon.   Yes [provider]  acetaminophen (TYLENOL) 325 MG tablet Take by mouth. Patient not taking: Reported on 05/09/2022    [provider]  VENTOLIN HFA 108 (90 Base) MCG/ACT inhaler Inhale into the lungs. Patient not taking: Reported on 05/09/2022 01/08/21   [provider]     [facility]  OB History  Gravida Para Term Preterm AB Living  2 0 0 0 1 0  SAB IAB Ectopic Multiple Live Births  1 0 0 0 0    # Outcome Date GA Lbr Len/2nd Weight Sex Delivery Anes PTL Lv  2 Current           1 SAB 02/2019              Social History: She  reports that she has never smoked. She has never been exposed to tobacco smoke. She has never used smokeless tobacco. She reports that she does not drink alcohol and does not use drugs.  Family History: family history is not on file.   Review of Systems: A full review of systems was performed and negative except as noted in the HPI.     Physical Exam:  Cisney Signs: BP 135/81 (BP Location: Left Arm)   Pulse 90   Temp 98 F (36.7 C) (Oral)   Ht '4\' 9"'$  (1.448 m)   Wt 78.5 kg   LMP 08/13/2021 (Exact Date)   BMI 37.44 kg/m   General: no acute distress.  HEENT: normocephalic, atraumatic Heart: regular rate & rhythm Lungs: normal respiratory effort Abdomen: soft, gravid, non-tender;   Pelvic:   External: Normal external female genitalia  Cervix: Dilation: 1 / Effacement (%): 50 /      Extremities: non-tender, symmetric, no edema bilaterally.  DTRs: +2  Neurologic: Alert & oriented x 3.    Results for orders placed or performed during the hospital encounter of 05/09/22 (from the past 24 hour(s))  ROM Plus (ARLouisanly)     Status: None   Collection Time: 05/09/22  2:05 AM  Result Value Ref Range   Rom Plus POSITIVE     Pertinent Results:  Prenatal Labs: Blood type/Rh O pos  Antibody screen Negative    Rubella Immune (09/15 0000)  Varicella Immune  RPR Nonreactive (09/15 0000)   HBsAg Negative (09/15 0000)  Hep C NR   HIV Non-reactive (09/15 0000)   GC neg  Chlamydia neg  Genetic screening cfDNA negative   1 hour GTT 110  3 hour GTT N/a  GBS neg     FHT:  FHR: 130 bpm, variability: moderate,  accelerations:  Present,  decelerations:  Absent Category/reactivity:  Category I UC:   irregular   Cephalic by Leopolds and SVE   No results found.  Assessment:  Pamela Ramos is a 27 y.o. G53P0010 female at 70w3dwith SROM.   Plan:  1. Admit to Labor & Delivery - consents reviewed and obtained - Dr. JGlennon Macnotified of admission and plan of  care   2. Fetal Well being  - Fetal Tracing: category 1 - Group B Streptococcus ppx not indicated: GBS negative - Presentation: cephalic confirmed by sve   3. Routine OB: - Prenatal labs reviewed, as above - Rh positive - CBC, T&S, RPR on admit - Clear liquid diet , continuous IV fluids  4. Monitoring of labor  - Contractions monitored with external toco - Pelvis  adequate for trial of labor  - Plan for  augmentation of labor - Augmentation with misoprostol and oxytocin as appropriate  - Plan for  continuous fetal monitoring - Maternal pain control as desired; planning position changes , birth ball, and unmedicated labor support options  - Anticipate vaginal delivery  5. Post Partum Planning: - Infant feeding: breast feeding - Contraception: no method - Tdap vaccine:  declined - Flu vaccine: declined  Teion Ballin LMarlaine Hind CNM [id number]  FAvelino Leeds CNM Certified Nurse Midwife [facility]

## 2022-05-09 NOTE — Progress Notes (Signed)
Labor Progress Note  Pamela Ramos is a 27 y.o. G2P0010 at 36w3dby LMP admitted for rupture of membranes at 0100 today.   Subjective: feeling painful UCs, worse after Pitocin started.   Objective: BP 126/79 (BP Location: Right Arm)   Pulse 70   Temp 97.8 F (36.6 C) (Oral)   Resp 18   Ht '4\' 9"'$  (1.448 m)   Wt 78.5 kg   LMP 08/13/2021 (Exact Date)   BMI 37.45 kg/m  Notable VS details: reviewed Vitals:   05/09/22 0140 05/09/22 0629 05/09/22 0857  BP: 135/81 108/85 126/79     Fetal Assessment: FHT:  FHR: 130 bpm, variability: min-mod,  accelerations:  Present,  decelerations:  Present indeterminate due to UCs not tracing, unable to determine early vs late decels.  Category/reactivity:  Category II UC:   irregular, every 3-6 minutes; toco adjusted. Pitocin stopped due to  SVE:  2/90/-1, soft/anterior.  - IUPC and FSE placed   Membrane status: PROM at 0100 Amniotic color: clear  Labs: Lab Results  Component Value Date   WBC 8.1 05/09/2022   HGB 12.2 05/09/2022   HCT 36.9 05/09/2022   MCV 88.9 05/09/2022   PLT 196 05/09/2022    Assessment / Plan: G2P0010 at 316w3dPROM at 0100 Latent labor, augmentation  Labor: s/p 1 dose of cytotec, Pitocin titrating, but DC due to indeterminate decels. IUPC and FSE placed for better monitoring.  Preeclampsia:  no elevated BP since admission, mild range BP x 1 yesterday in clinic. Will monitor closely.  Fetal Wellbeing:  Category I Pain Control:  Labor support without medications; pt desires unmedicated.  I/D:  n/a; GBS Neg, ROM x 10hrs Anticipated MOD:  NSVD  Pamela FoundCNM 05/09/2022, 11:24 AM

## 2022-05-09 NOTE — Brief Op Note (Signed)
05/09/2022  9:15 PM  PATIENT:  Pamela Ramos  27 y.o. female  PRE-OPERATIVE DIAGNOSIS:  38+[redacted] weeks EGA Cephalopelvic disproportion   POST-OPERATIVE DIAGNOSIS:  sam e Vigorous female   PROCEDURE:  Procedure(s): CESAREAN SECTION Primary LTCS   SURGEON:  Surgeon(s) and Role:    * Nikolus Marczak, Gwen Her, MD - Primary  PHYSICIAN ASSISTANT: Hassan Buckler , cnm   ASSISTANTS: none   ANESTHESIA:   spinal  EBL:  qbl : 585cc IOF 1000 cc uo 300 cc BLOOD ADMINISTERED:none  DRAINS: Urinary Catheter (Foley)   LOCAL MEDICATIONS USED:  MARCAINE   60 cc 0.25% + 20 cc NS . 60 cc used   SPECIMEN:  No Specimen  DISPOSITION OF SPECIMEN:  N/A  COUNTS:  YES  TOURNIQUET:  * No tourniquets in log *  DICTATION: .Other Dictation: Dictation Number verbal  PLAN OF CARE: Admit to inpatient   PATIENT DISPOSITION:  PACU - hemodynamically stable.   Delay start of Pharmacological VTE agent (>24hrs) due to surgical blood loss or risk of bleeding: not applicable

## 2022-05-09 NOTE — Anesthesia Procedure Notes (Signed)
Spinal  Patient location during procedure: OR Reason for block: surgical anesthesia Staffing Performed: resident/CRNA  Anesthesiologist: Darrin Nipper, MD Resident/CRNA: Rolla Plate, CRNA Performed by: Rolla Plate, CRNA Authorized by: Darrin Nipper, MD   Preanesthetic Checklist Completed: patient identified, IV checked, site marked, risks and benefits discussed, surgical consent, monitors and equipment checked, pre-op evaluation and timeout performed Spinal Block Patient position: sitting Prep: ChloraPrep and site prepped and draped Patient monitoring: heart rate, continuous pulse ox, blood pressure and cardiac monitor Approach: midline Location: L4-5 Injection technique: single-shot Needle Needle type: Whitacre and Introducer  Needle gauge: 24 G Needle length: 9 cm Assessment Events: CSF return Additional Notes Negative paresthesia. Negative blood return. Positive free-flowing CSF. Expiration date of kit checked and confirmed. Patient tolerated procedure well, without complications.

## 2022-05-09 NOTE — Progress Notes (Signed)
Labor Progress Note  Pamela Ramos is a 27 y.o. G2P0010 at 61w3dby LMP admitted for rupture of membranes at 0100 today.   Subjective: feeling painful UCs and pelvic pressure.   Objective: BP (!) 133/90 (BP Location: Right Arm)   Pulse 76   Temp 97.7 F (36.5 C) (Oral)   Resp 18   Ht '4\' 9"'$  (1.448 m)   Wt 78.5 kg   LMP 08/13/2021 (Exact Date)   BMI 37.45 kg/m  Notable VS details: reviewed Vitals:   05/09/22 0140 05/09/22 0629 05/09/22 0857 05/09/22 1336  BP: 135/81 108/85 126/79 (!) 133/90     Fetal Assessment: FHT:  FHR: 130 bpm, variability: min-mod,  accelerations:  Present,  decelerations:  Present early Category/reactivity:  Category II UC:   irregular, every 1.5-4 minutes; Pitocin at 244mmin; MVUs 210-235  SVE 1.5/50 (swollen)/-2; soft, anterior.   Membrane status: PROM at 0100 Amniotic color: clear  Labs: Lab Results  Component Value Date   WBC 8.1 05/09/2022   HGB 12.2 05/09/2022   HCT 36.9 05/09/2022   MCV 88.9 05/09/2022   PLT 196 05/09/2022    Assessment / Plan: G2P0010 at 3845w3dROM at 0100 Latent labor, augmentation  Labor: s/p 1 dose of cytotec, Pitocin titrating, with adequate MVUs, cervix swelling- ordered Benadryl IV now.  Preeclampsia:mild range BP noted, mild range BP x 1 yesterday in clinic. P/C ratio in office 383; Pre-eclampsia without severe features.  Fetal Wellbeing:  Category I Pain Control:  Labor support without medications; pt considering IVPM now.  I/D:  n/a; GBS Neg, ROM x 15hrs Anticipated MOD:  NSVD  [name], CNM 05/09/2022, 4:10 PM

## 2022-05-09 NOTE — Discharge Summary (Signed)
Obstetrical Discharge Summary  Patient Name: Pamela Ramos DOB: 02/12/1996 MRN: [id number]  Date of Admission: 05/09/2022 Date of Delivery: 05/09/22 Delivered by: Schermerhorn MD Date of Discharge: 05/11/2022  Primary OB: [facility] [id number] last menstrual period was 08/13/2021 (exact date). EDC Estimated Date of Delivery: 05/20/22 Gestational Age at Delivery: [redacted]w[redacted]d   Antepartum complications:  Elevated BP at term, P/C ratio 383  Admitting Diagnosis: Leakage of amniotic fluid [O42.90]  Secondary Diagnosis: low transverse cesarean; CPD  Patient Active Problem List   Diagnosis Date Noted   Preeclampsia 05/10/2022   Cesarean delivery delivered 05/10/2022   Abdominal pain affecting pregnancy 03/04/2022   Encounter for supervision of normal pregnancy 11/06/2021    Discharge Diagnosis: Term Pregnancy Delivered and Preeclampsia (mild)    - Cephalopelvic disproportion   Augmentation: Pitocin and Cytotec Complications: None Intrapartum complications/course: admitted with SROM, augmented with cytotec then Pitocin without cervical change, IUPC placed with adequate MVUs for greater than 8hrs. See Op note for details of surgery.  Delivery Type: primary cesarean section, low transverse incision Anesthesia: spinal anesthesia Placenta: manual removal To Pathology: No  Laceration: none Episiotomy: none QBL: 547ml  Newborn Data: Live born female infant Birth Weight:  6#7 APGAR: 9/9   Newborn Delivery   Birth date/time:  Delivery type:       Postpartum Procedures: none Edinburgh:     05/10/2022    9:01 AM  Pamela Ramos Postnatal Depression Scale Screening Tool  I have been able to laugh and see the funny side of things. 0  I have looked forward with enjoyment to things. 0  I have blamed myself unnecessarily when things went wrong. 0  I have been anxious or worried for no good reason. 0  I have felt scared or panicky for no good reason. 0  Things have been getting  on top of me. 0  I have been so unhappy that I have had difficulty sleeping. 0  I have felt sad or miserable. 0  I have been so unhappy that I have been crying. 0  The thought of harming myself has occurred to me. 0  Edinburgh Postnatal Depression Scale Total 0     Post partum course-Cesarean Section:  Patient had an uncomplicated postpartum course.  By time of discharge on POD#2, her pain was controlled on oral pain medications; she had appropriate lochia and was ambulating, voiding without difficulty, tolerating regular diet and passing flatus.   She was deemed stable for discharge to home.    Discharge Physical Exam:  BP 113/76 (BP Location: Right Arm)   Pulse 82   Temp 98.2 F (36.8 C) (Oral)   Resp 18   Ht 4\' 9"  (1.448 m)   Wt 78.5 kg   LMP 08/13/2021 (Exact Date)   SpO2 100%   Breastfeeding Unknown   BMI 37.45 kg/m   General: NAD CV: RRR Pulm: nl effort ABD: s/nd/nt, fundus firm and below the umbilicus Lochia: moderate Incision: c/d/I, covered with occlusive OP site dressing  DVT Evaluation: LE non-ttp, no evidence of DVT on exam.  Hemoglobin  Date Value Ref Range Status  05/10/2022 10.8 (L) 12.0 - 15.0 g/dL Final   HCT  Date Value Ref Range Status  05/10/2022 32.3 (L) 36.0 - 46.0 % Final    Risk assessment for postpartum VTE and prophylactic treatment: Very high risk factors: None High risk factors: Unscheduled cesarean after labor  Moderate risk factors: Preeclampsia  and BMI 30-40 kg/m2  Postpartum VTE prophylaxis with LMWH not indicated  Disposition: stable, discharge to home. Baby Feeding: breast feeding Baby Disposition: home with mom  Rh Immune globulin indicated: No Rubella vaccine given: was not indicated Varivax vaccine given: was not indicated Flu vaccine given in AP setting: no  Tdap vaccine given in AP setting: No RSV vaccine given AP setting: No  Contraception: no method  Prenatal Labs:  Blood type/Rh O pos  Antibody screen Negative     Rubella Immune (09/15 0000)   Varicella Immune  RPR Nonreactive (09/15 0000)   HBsAg Negative (09/15 0000)  Hep C NR   HIV Non-reactive (09/15 0000)   GC neg  Chlamydia neg  Genetic screening cfDNA negative   1 hour GTT 110  3 hour GTT N/a  GBS neg       Plan:  Pamela Ramos was discharged to home in good condition.   Discharge Medications: Allergies as of 05/11/2022   No Known Allergies      Medication List     TAKE these medications    acetaminophen 500 MG tablet Commonly known as: TYLENOL Take 1 tablet (500 mg total) by mouth every 6 (six) hours as needed. What changed:  medication strength how much to take when to take this reasons to take this   ibuprofen 600 MG tablet Commonly known as: ADVIL Take 1 tablet (600 mg total) by mouth every 6 (six) hours as needed.   oxyCODONE 5 MG immediate release tablet Commonly known as: Oxy IR/ROXICODONE Take 1-2 tablets (5-10 mg total) by mouth every 4 (four) hours as needed for moderate pain.   prenatal multivitamin Tabs tablet Take 1 tablet by mouth daily at 12 noon.   Ventolin HFA 108 (90 Base) MCG/ACT inhaler Generic drug: albuterol Inhale into the lungs.         Follow-up Information     Schermerhorn, Gwen Her, MD Follow up in 2 week(s).   Specialty: Obstetrics and Gynecology Why: routine post-op visit Contact information: [address] [place] [address] [phone]         [facility]. Schedule an appointment as soon as possible for a visit on 05/13/2022.   Why: for a BP check - Mon or Tue Contact information: [place] [phone]                Signed: Clydene Laming, CNM 05/11/2022 1:06 PM

## 2022-05-09 NOTE — Anesthesia Preprocedure Evaluation (Signed)
Anesthesia Evaluation  Patient identified by MRN, date of birth, ID band Patient awake    Reviewed: Allergy & Precautions, NPO status , Patient's Chart, lab work & pertinent test results  History of Anesthesia Complications Negative for: history of anesthetic complications  Airway Mallampati: I   Neck ROM: Full    Dental no notable dental hx.    Pulmonary neg pulmonary ROS   Pulmonary exam normal breath sounds clear to auscultation       Cardiovascular Exercise Tolerance: Good Normal cardiovascular exam Rhythm:Regular Rate:Normal  Preeclampsia without severe features   Neuro/Psych negative neurological ROS     GI/Hepatic negative GI ROS,,,  Endo/Other  Obesity   Renal/GU negative Renal ROS     Musculoskeletal   Abdominal   Peds  Hematology negative hematology ROS (+)   Anesthesia Other Findings 27 yo G2P0010 at 68 3/7 presenting for c-section for cephalopelvic disproportion.  Reproductive/Obstetrics (+) Pregnancy                             Anesthesia Physical Anesthesia Plan  ASA: 2  Anesthesia Plan: Spinal   Post-op Pain Management:    Induction:   PONV Risk Score and Plan: 2 and Ondansetron and Treatment may vary due to age or medical condition  Airway Management Planned: Natural Airway and Nasal Cannula  Additional Equipment:   Intra-op Plan:   Post-operative Plan:   Informed Consent: I have reviewed the patients History and Physical, chart, labs and discussed the procedure including the risks, benefits and alternatives for the proposed anesthesia with the patient or authorized representative who has indicated his/her understanding and acceptance.     Dental Advisory Given  Plan Discussed with: Anesthesiologist, CRNA and Surgeon  Anesthesia Plan Comments: (Patient reports no bleeding problems and no anticoagulant use.  Plan for spinal with backup GA  Patient  consented for risks of anesthesia including but not limited to:  - adverse reactions to medications - damage to eyes, teeth, lips or other oral mucosa - nerve damage due to positioning  - risk of bleeding, infection and or nerve damage from spinal that could lead to paralysis - risk of headache or failed spinal - damage to teeth, lips or other oral mucosa - sore throat or hoarseness - damage to heart, brain, nerves, lungs, other parts of body or loss of life  Patient voiced understanding.)       Anesthesia Quick Evaluation

## 2022-05-09 NOTE — Progress Notes (Addendum)
Labor Progress Note  Pamela Ramos is a 27 y.o. G2P0010 at 30w3dby LMP admitted for rupture of membranes at 0100 today.   Subjective: feeling painful UCs, spaced out since last cytotec dose. Continues to leak clear fluid with some pink bloody show.   Objective: BP 108/85 (BP Location: Right Arm)   Pulse 88   Temp 97.8 F (36.6 C) (Oral)   Resp 18   Ht '4\' 9"'$  (1.448 m)   Wt 78.5 kg   LMP 08/13/2021 (Exact Date)   BMI 37.45 kg/m  Notable VS details: reviewed Vitals:   05/09/22 0140 05/09/22 0629  BP: 135/81 108/85     Fetal Assessment: FHT:  FHR: 130 bpm, variability: min-mod,  accelerations:  Present,  decelerations:  Absent Category/reactivity:  Category I UC:   irregular, every 3-6 minutes SVE:   external os 3, internal os 1/80/-2, soft/midposition.   Membrane status: PROM at 0100 Amniotic color: clear  Labs: Lab Results  Component Value Date   WBC 8.1 05/09/2022   HGB 12.2 05/09/2022   HCT 36.9 05/09/2022   MCV 88.9 05/09/2022   PLT 196 05/09/2022    Assessment / Plan: G2P0010 at 339w3dPROM at 0100 Latent labor, augmentation  Labor: s/p 1 dose of cytotec, will start Pitocin.   Preeclampsia:  no elevated BP since admission, mild range BP x 1 yesterday in clinic. Will monitor closely.  Fetal Wellbeing:  Category I Pain Control:  Labor support without medications; pt desires unmedicated.  I/D:  n/a; GBS Neg, ROM x 8hrs Anticipated MOD:  NSVD  ReFrancetta FoundCNM 05/09/2022, 8:43 AM

## 2022-05-09 NOTE — Progress Notes (Signed)
Patient ID: Pamela Ramos, female   DOB: [birth year], 27 y.o.   MRN: [id number] Pt now ruptured for 9 hours and adequate ctx pattern . My exam narrow arch  and head not engaged . CPD . I recommend LTCS .  The risks of cesarean section discussed with the patient included but were not limited to: bleeding which may require transfusion or reoperation; infection which may require antibiotics; injury to bowel, bladder, ureters or other surrounding organs; injury to the fetus; need for additional procedures including hysterectomy in the event of a life-threatening hemorrhage; placental abnormalities wth subsequent pregnancies, incisional problems, thromboembolic phenomenon and other postoperative/anesthesia complications. The patient concurred with the proposed plan, giving informed written consent for the procedure.   . Anesthesia and OR aware. Preoperative prophylactic antibiotics and SCDs ordered on call to the OR.  To OR when ready.

## 2022-05-10 ENCOUNTER — Encounter: Payer: Self-pay | Admitting: Obstetrics and Gynecology

## 2022-05-10 DIAGNOSIS — O1490 Unspecified pre-eclampsia, unspecified trimester: Secondary | ICD-10-CM | POA: Diagnosis present

## 2022-05-10 LAB — CBC
HCT: 32.3 % — ABNORMAL LOW (ref 36.0–46.0)
Hemoglobin: 10.8 g/dL — ABNORMAL LOW (ref 12.0–15.0)
MCH: 29.4 pg (ref 26.0–34.0)
MCHC: 33.4 g/dL (ref 30.0–36.0)
MCV: 88 fL (ref 80.0–100.0)
Platelets: 174 10*3/uL (ref 150–400)
RBC: 3.67 MIL/uL — ABNORMAL LOW (ref 3.87–5.11)
RDW: 13.2 % (ref 11.5–15.5)
WBC: 18.8 10*3/uL — ABNORMAL HIGH (ref 4.0–10.5)
nRBC: 0 % (ref 0.0–0.2)

## 2022-05-10 MED ORDER — OXYCODONE HCL 5 MG PO TABS: 5.0000 mg | ORAL_TABLET | ORAL | Status: DC | PRN

## 2022-05-10 MED ORDER — NALOXONE HCL 0.4 MG/ML IJ SOLN: 0.4000 mg | INTRAMUSCULAR | Status: DC | PRN

## 2022-05-10 MED ORDER — DIPHENHYDRAMINE HCL 50 MG/ML IJ SOLN: 12.5000 mg | INTRAMUSCULAR | Status: DC | PRN

## 2022-05-10 MED ORDER — ENOXAPARIN SODIUM 40 MG/0.4ML IJ SOSY: 40.0000 mg | PREFILLED_SYRINGE | INTRAMUSCULAR | Status: DC

## 2022-05-10 MED ORDER — PRENATAL MULTIVITAMIN CH
1.0000 | ORAL_TABLET | Freq: Every day | ORAL | Status: DC
  Administered 2022-05-10 – 2022-05-11 (×2): 1 via ORAL
  Filled 2022-05-10 (×2): qty 1

## 2022-05-10 MED ORDER — KETOROLAC TROMETHAMINE 30 MG/ML IJ SOLN
30.0000 mg | Freq: Four times a day (QID) | INTRAMUSCULAR | Status: AC
  Administered 2022-05-10 (×3): 30 mg via INTRAVENOUS
  Filled 2022-05-10 (×3): qty 1

## 2022-05-10 MED ORDER — DIBUCAINE (PERIANAL) 1 % EX OINT: 1.0000 | TOPICAL_OINTMENT | CUTANEOUS | Status: DC | PRN

## 2022-05-10 MED ORDER — ACETAMINOPHEN 500 MG PO TABS
1000.0000 mg | ORAL_TABLET | Freq: Four times a day (QID) | ORAL | Status: DC
  Administered 2022-05-10 – 2022-05-11 (×5): 1000 mg via ORAL
  Filled 2022-05-10 (×5): qty 2

## 2022-05-10 MED ORDER — MEPERIDINE HCL 25 MG/ML IJ SOLN: 6.2500 mg | INTRAMUSCULAR | Status: DC | PRN

## 2022-05-10 MED ORDER — ZOLPIDEM TARTRATE 5 MG PO TABS: 5.0000 mg | ORAL_TABLET | Freq: Every evening | ORAL | Status: DC | PRN

## 2022-05-10 MED ORDER — SIMETHICONE 80 MG PO CHEW
80.0000 mg | CHEWABLE_TABLET | Freq: Three times a day (TID) | ORAL | Status: DC
  Administered 2022-05-10 – 2022-05-11 (×4): 80 mg via ORAL
  Filled 2022-05-10 (×4): qty 1

## 2022-05-10 MED ORDER — KETOROLAC TROMETHAMINE 30 MG/ML IJ SOLN: 30.0000 mg | Freq: Four times a day (QID) | INTRAMUSCULAR | Status: AC | PRN

## 2022-05-10 MED ORDER — DIPHENHYDRAMINE HCL 25 MG PO CAPS: 25.0000 mg | ORAL_CAPSULE | ORAL | Status: DC | PRN

## 2022-05-10 MED ORDER — COCONUT OIL OIL: 1.0000 | TOPICAL_OIL | Status: DC | PRN

## 2022-05-10 MED ORDER — IBUPROFEN 600 MG PO TABS: 600.0000 mg | ORAL_TABLET | Freq: Four times a day (QID) | ORAL | Status: DC

## 2022-05-10 MED ORDER — NIFEDIPINE ER OSMOTIC RELEASE 30 MG PO TB24: 30.0000 mg | ORAL_TABLET | Freq: Every day | ORAL | Status: DC

## 2022-05-10 MED ORDER — IBUPROFEN 600 MG PO TABS
600.0000 mg | ORAL_TABLET | Freq: Four times a day (QID) | ORAL | Status: DC
  Administered 2022-05-11 (×2): 600 mg via ORAL
  Filled 2022-05-10 (×2): qty 1

## 2022-05-10 MED ORDER — SENNOSIDES-DOCUSATE SODIUM 8.6-50 MG PO TABS
2.0000 | ORAL_TABLET | Freq: Every day | ORAL | Status: DC
  Administered 2022-05-11: 2 via ORAL
  Filled 2022-05-10: qty 2

## 2022-05-10 MED ORDER — NALOXONE HCL 4 MG/10ML IJ SOLN: 1.0000 ug/kg/h | INTRAVENOUS | Status: DC | PRN

## 2022-05-10 MED ORDER — ACETAMINOPHEN 500 MG PO TABS
1000.0000 mg | ORAL_TABLET | Freq: Four times a day (QID) | ORAL | Status: DC
  Administered 2022-05-10: 1000 mg via ORAL
  Filled 2022-05-10: qty 2

## 2022-05-10 MED ORDER — MENTHOL 3 MG MT LOZG: 1.0000 | LOZENGE | OROMUCOSAL | Status: DC | PRN

## 2022-05-10 MED ORDER — ONDANSETRON HCL 4 MG/2ML IJ SOLN: 4.0000 mg | Freq: Three times a day (TID) | INTRAMUSCULAR | Status: DC | PRN

## 2022-05-10 MED ORDER — SIMETHICONE 80 MG PO CHEW: 80.0000 mg | CHEWABLE_TABLET | ORAL | Status: DC | PRN

## 2022-05-10 MED ORDER — OXYTOCIN-SODIUM CHLORIDE 30-0.9 UT/500ML-% IV SOLN: 2.5000 [IU]/h | INTRAVENOUS | Status: AC

## 2022-05-10 MED ORDER — DIPHENHYDRAMINE HCL 25 MG PO CAPS: 25.0000 mg | ORAL_CAPSULE | Freq: Four times a day (QID) | ORAL | Status: DC | PRN

## 2022-05-10 MED ORDER — KETOROLAC TROMETHAMINE 30 MG/ML IJ SOLN
30.0000 mg | Freq: Four times a day (QID) | INTRAMUSCULAR | Status: DC
  Administered 2022-05-10: 30 mg via INTRAVENOUS
  Filled 2022-05-10: qty 1

## 2022-05-10 MED ORDER — GABAPENTIN 300 MG PO CAPS
300.0000 mg | ORAL_CAPSULE | Freq: Every day | ORAL | Status: DC
  Administered 2022-05-10: 300 mg via ORAL
  Filled 2022-05-10: qty 1

## 2022-05-10 MED ORDER — ACETAMINOPHEN 500 MG PO TABS: 1000.0000 mg | ORAL_TABLET | Freq: Four times a day (QID) | ORAL | Status: DC

## 2022-05-10 MED ORDER — SODIUM CHLORIDE 0.9% FLUSH: 3.0000 mL | INTRAVENOUS | Status: DC | PRN

## 2022-05-10 MED ORDER — WITCH HAZEL-GLYCERIN EX PADS: 1.0000 | MEDICATED_PAD | CUTANEOUS | Status: DC | PRN

## 2022-05-10 MED ORDER — SCOPOLAMINE 1 MG/3DAYS TD PT72
1.0000 | MEDICATED_PATCH | Freq: Once | TRANSDERMAL | Status: DC
  Filled 2022-05-10: qty 1

## 2022-05-10 MED ORDER — MORPHINE SULFATE (PF) 2 MG/ML IV SOLN: 1.0000 mg | INTRAVENOUS | Status: DC | PRN

## 2022-05-10 NOTE — Progress Notes (Signed)
Postop Day  1  Subjective: no complaints and tolerating PO  Doing well, no concerns. Resting in bed.  Foley remains in situ draining clear yellow urine.    No fever/chills, chest pain, shortness of breath, nausea/vomiting, or leg pain. No nipple or breast pain. No headache, visual changes, or RUQ/epigastric pain.  Objective: BP 105/72 (BP Location: Right Arm)   Pulse 79   Temp 98.3 F (36.8 C) (Oral)   Resp 18   Ht 4\' 9"  (1.448 m)   Wt 78.5 kg   LMP 08/13/2021 (Exact Date)   SpO2 99%   Breastfeeding Unknown   BMI 37.45 kg/m   Vitals:   05/09/22 0629 05/09/22 0857 05/09/22 1336 05/09/22 1822  BP: 108/85 126/79 (!) 133/90 120/75   05/09/22 2136 05/09/22 2230 05/09/22 2300 05/09/22 2320  BP: (!) 116/93 (!) 158/89 (!) 160/95 (!) 140/99   05/10/22 0001 05/10/22 0139 05/10/22 0310 05/10/22 0820  BP: (!) 156/90 (!) 143/88 128/86 105/72      Physical Exam:  General: alert, cooperative, and no distress Breasts: soft/nontender CV: RRR Pulm: nl effort, CTABL Extremities: 2+ edema bilaterally in LE Abdomen: soft, non-tender, active bowel sounds Uterine Fundus: firm Incision: no significant drainage, covered with pressure dressing  Perineum: minimal edema, intact Lochia: appropriate DVT Evaluation: No evidence of DVT seen on physical exam.  Recent Labs    05/09/22 0424 05/10/22 0608  HGB 12.2 10.8*  HCT 36.9 32.3*  WBC 8.1 18.8*  PLT 196 174    Assessment/Plan: 27 y.o. G2P1011 postpartum day # 1  -Continue routine postpartum care -Lactation consult PRN for breastfeeding  -Acute blood loss anemia - hemodynamically stable and asymptomatic; start PO ferrous sulfate BID with stool softeners  -Denies current preeclampsia symptoms  -Blood pressures now controlled with Procardia 30mg  XL, next dose due at 2200 today  -Remove foley after 12 hours and up to bathroom PRN   Disposition: Continue inpatient postpartum care    LOS: 1 day   Minda Meo, CNM 05/10/2022,  9:12 AM   ----- Drinda Butts  Certified Nurse Midwife [facility]

## 2022-05-10 NOTE — Anesthesia Post-op Follow-up Note (Addendum)
  Anesthesia Pain Follow-up Note  Patient: Yunique Coulson Valbuena  Day #: 1  Date of Follow-up: 05/10/2022 Time: 8:48 AM  Last Vitals:  Vitals:   05/10/22 0600 05/10/22 0820  BP:  105/72  Pulse: 77 79  Resp:  18  Temp:  36.8 C  SpO2: 98% 99%    Level of Consciousness: alert  Pain: 2 /10   Side Effects:None  Catheter Site Exam:clean  Anti-Coag Meds (From admission, onward)    Start     Dose/Rate Route Frequency Ordered Stop   05/11/22 2200  enoxaparin (LOVENOX) injection 40 mg        40 mg Subcutaneous Every 24 hours 05/10/22 0114          Plan: D/C from anesthesia care at surgeon's request  Timmothy Baranowski,  Clearnce Sorrel

## 2022-05-10 NOTE — Anesthesia Postprocedure Evaluation (Signed)
Anesthesia Post Note  Patient: Pamela Ramos  Procedure(s) Performed: [place]  Patient location during evaluation: Mother Baby Anesthesia Type: Spinal Level of consciousness: oriented and awake and alert Pain management: pain level controlled Ladnier Signs Assessment: post-procedure Ruud signs reviewed and stable Respiratory status: spontaneous breathing and respiratory function stable Cardiovascular status: blood pressure returned to baseline and stable Postop Assessment: no headache, no backache, no apparent nausea or vomiting and able to ambulate Anesthetic complications: no   No notable events documented.   Last Vitals:  Vitals:   05/10/22 0600 05/10/22 0820  BP:  105/72  Pulse: 77 79  Resp:  18  Temp:  36.8 C  SpO2: 98% 99%    Last Pain:  Vitals:   05/10/22 0820  TempSrc: Oral  PainSc:                  Estill Batten

## 2022-05-10 NOTE — Lactation Note (Signed)
This note was copied from a baby's chart. Lactation Consultation Note  Patient Name: Pamela Ramos Today's Date: 05/10/2022 Age:27 hours Reason for consult: Initial assessment;Primapara;Early term 37-38.6wks;RN request;Breastfeeding assistance;Mother's request   Maternal Data This is mom's 1st baby, C/S. Mom with no pertinent medical history. On initial visit assisted mom with breastfeeding. Has patient been taught Hand Expression?: Yes Does the patient have breastfeeding experience prior to this delivery?: No  Feeding Mother's Current Feeding Choice: Breast Milk Provided mom with tips and strategies to maximize position and latch techniques. Encouraged mom to support her breast. Baby latched deeply , audible swallows identified by mom. LATCH Score Latch: Repeated attempts needed to sustain latch, nipple held in mouth throughout feeding, stimulation needed to elicit sucking reflex. (once latched baby rhythmically sucked)  Audible Swallowing: Spontaneous and intermittent  Type of Nipple: Everted at rest and after stimulation  Comfort (Breast/Nipple): Soft / non-tender  Hold (Positioning): Assistance needed to correctly position infant at breast and maintain latch.  LATCH Score: 8   Interventions Interventions: Breast feeding basics reviewed;Assisted with latch;Breast massage;Hand express;Breast compression;Position options;Support pillows;Adjust position;Education  Discharge Pump: Personal  Consult Status Consult Status: Follow-up Date: 05/11/22 Follow-up type: In-patient  Update provided to care nurse.  Pamela Ramos 05/10/2022, 2:00 PM

## 2022-05-11 ENCOUNTER — Encounter: Payer: Self-pay | Admitting: Obstetrics and Gynecology

## 2022-05-11 MED ORDER — OXYCODONE HCL 5 MG PO TABS: 5.0000 mg | ORAL_TABLET | ORAL | 0 refills | Status: DC | PRN

## 2022-05-11 MED ORDER — ACETAMINOPHEN 500 MG PO TABS: 500.0000 mg | ORAL_TABLET | Freq: Four times a day (QID) | ORAL | 0 refills | Status: DC | PRN

## 2022-05-11 MED ORDER — IBUPROFEN 600 MG PO TABS: 600.0000 mg | ORAL_TABLET | Freq: Four times a day (QID) | ORAL | Status: DC | PRN

## 2022-05-11 NOTE — Progress Notes (Signed)
Verb understanding of d/c instr/d/c to entrance via w/c

## 2022-05-11 NOTE — Lactation Note (Addendum)
This note was copied from a baby's chart. Lactation Consultation Note  Patient Name: Pamela Ramos Today's Date: 05/11/2022 Age:27 hours Reason for consult: Follow-up assessment;Primapara;Early term 37-38.6wks  For d/c today Maternal Data Has patient been taught Hand Expression?: Yes Does the patient have breastfeeding experience prior to this delivery?: No  Feeding Mother's Current Feeding Choice: Breast Milk and Formula Nipple Type: Dr. Roosvelt Harps level 1 I did not observe a feeding, baby had just been fed, mom feels that baby is latching and nursing better today.  mom requested manual breast pump; Harmony pump given and demonstrated use  LATCH Score                    Lactation Tools Discussed/Used    Interventions Interventions: Hand pump;Education Mom given mother/baby handbook, instructed in use of harmony breast pump and cleaning, milk storage Discharge Discharge Education: Engorgement and breast care Pump: Personal;Manual WIC Program: Yes  Consult Status Consult Status: PRN Date: 05/11/22 Follow-up type: In-patient    Ferol Luz 05/11/2022, 10:34 AM

## 2022-05-14 DIAGNOSIS — O1415 Severe pre-eclampsia, complicating the puerperium: Secondary | ICD-10-CM | POA: Diagnosis not present

## 2022-05-23 DIAGNOSIS — Z3483 Encounter for supervision of other normal pregnancy, third trimester: Secondary | ICD-10-CM | POA: Diagnosis not present

## 2022-05-23 DIAGNOSIS — Z3482 Encounter for supervision of other normal pregnancy, second trimester: Secondary | ICD-10-CM | POA: Diagnosis not present

## 2022-06-12 ENCOUNTER — Telehealth: Payer: Self-pay

## 2022-06-12 NOTE — Telephone Encounter (Signed)
[facility]- Discharge Call Backs-Spoke to patient on the phone about the following below. 1-Do you have any questions or concerns about yourself as you heal? No 2-Any concerns or questions about your baby?No Is your baby eating, peeing,pooping well?Yes 3-Reviewed ABC's of safe sleep. 4-How was your stay at the hospital?Good 5- Did our team work together to care for you?Yes You should be receiving a survey in the mail soon.   We would really appreciate it if you could fill that out for us and return it in the mail.  We value the feedback to make improvements and continue the great work we do.   If you have any questions please feel free to call me back at [phone]  

## 2022-06-24 DIAGNOSIS — Z3009 Encounter for other general counseling and advice on contraception: Secondary | ICD-10-CM | POA: Diagnosis not present

## 2022-10-01 DIAGNOSIS — Z1389 Encounter for screening for other disorder: Secondary | ICD-10-CM | POA: Diagnosis not present

## 2022-10-01 DIAGNOSIS — G44209 Tension-type headache, unspecified, not intractable: Secondary | ICD-10-CM | POA: Diagnosis not present

## 2022-10-01 DIAGNOSIS — G43009 Migraine without aura, not intractable, without status migrainosus: Secondary | ICD-10-CM | POA: Diagnosis not present

## 2022-10-01 DIAGNOSIS — Z1331 Encounter for screening for depression: Secondary | ICD-10-CM | POA: Diagnosis not present

## 2023-02-04 ENCOUNTER — Other Ambulatory Visit: Payer: Self-pay

## 2023-02-04 DIAGNOSIS — J020 Streptococcal pharyngitis: Secondary | ICD-10-CM

## 2023-02-04 DIAGNOSIS — Z20822 Contact with and (suspected) exposure to covid-19: Secondary | ICD-10-CM

## 2023-02-04 LAB — POC COVID19 BINAXNOW: SARS Coronavirus 2 Ag: POSITIVE — AB

## 2023-02-04 NOTE — Progress Notes (Signed)
S/Sx started last night: Chills (Hasn't checked temp with a thermometer) Cough Sore throat  Someone staying at her home tested positive for covid today (they went to [facility])

## 2023-02-04 NOTE — Progress Notes (Signed)
Tested for covid and positive results reported to pt and provider. She will treat symptoms and call if worsening.

## 2023-02-12 DIAGNOSIS — U071 COVID-19: Secondary | ICD-10-CM | POA: Diagnosis not present

## 2023-02-25 ENCOUNTER — Other Ambulatory Visit: Payer: Self-pay | Admitting: Physician Assistant

## 2023-02-25 ENCOUNTER — Other Ambulatory Visit: Payer: Self-pay

## 2023-02-25 DIAGNOSIS — R300 Dysuria: Secondary | ICD-10-CM

## 2023-02-25 LAB — POCT URINALYSIS DIPSTICK
Bilirubin, UA: NEGATIVE
Glucose, UA: NEGATIVE
Ketones, UA: NEGATIVE
Nitrite, UA: NEGATIVE
Protein, UA: NEGATIVE
Spec Grav, UA: <=1.005 — AB (ref 1.010–1.025)
Urobilinogen, UA: 0.2 U/dL
pH, UA: 6 (ref 5.0–8.0)

## 2023-02-25 MED ORDER — NITROFURANTOIN MONOHYD MACRO 100 MG PO CAPS: 100.0000 mg | ORAL_CAPSULE | Freq: Two times a day (BID) | ORAL | 0 refills | Status: AC

## 2023-02-25 MED ORDER — PHENAZOPYRIDINE HCL 95 MG PO TABS: 95.0000 mg | ORAL_TABLET | Freq: Three times a day (TID) | ORAL | 0 refills | Status: AC | PRN

## 2023-02-25 NOTE — Progress Notes (Signed)
Frequent voiding & slight burning x 4 days.

## 2023-02-27 LAB — URINE CULTURE

## 2023-04-18 DIAGNOSIS — J069 Acute upper respiratory infection, unspecified: Secondary | ICD-10-CM | POA: Diagnosis not present

## 2023-04-18 DIAGNOSIS — Z1389 Encounter for screening for other disorder: Secondary | ICD-10-CM | POA: Diagnosis not present

## 2023-05-12 DIAGNOSIS — J301 Allergic rhinitis due to pollen: Secondary | ICD-10-CM | POA: Diagnosis not present

## 2023-05-12 DIAGNOSIS — Z0000 Encounter for general adult medical examination without abnormal findings: Secondary | ICD-10-CM | POA: Diagnosis not present

## 2023-05-12 DIAGNOSIS — K219 Gastro-esophageal reflux disease without esophagitis: Secondary | ICD-10-CM | POA: Diagnosis not present

## 2023-05-12 DIAGNOSIS — H66002 Acute suppurative otitis media without spontaneous rupture of ear drum, left ear: Secondary | ICD-10-CM | POA: Diagnosis not present

## 2023-07-31 ENCOUNTER — Other Ambulatory Visit

## 2023-07-31 DIAGNOSIS — R350 Frequency of micturition: Secondary | ICD-10-CM

## 2023-07-31 DIAGNOSIS — Z87440 Personal history of urinary (tract) infections: Secondary | ICD-10-CM

## 2023-07-31 LAB — POCT URINALYSIS DIPSTICK
Bilirubin, UA: NEGATIVE
Glucose, UA: NEGATIVE
Ketones, UA: NEGATIVE
Nitrite, UA: NEGATIVE
Protein, UA: NEGATIVE
Spec Grav, UA: 1.015 (ref 1.010–1.025)
Urobilinogen, UA: 0.2 U/dL
pH, UA: 7 (ref 5.0–8.0)

## 2023-07-31 NOTE — Addendum Note (Signed)
 Addended by: Katha Palau on: 07/31/2023 03:32 PM   Modules accepted: Orders

## 2023-07-31 NOTE — Addendum Note (Signed)
 Addended by: Marquette Sites F on: 07/31/2023 03:33 PM   Modules accepted: Orders

## 2023-07-31 NOTE — Progress Notes (Signed)
 Complains of frequency and hx UTI and UA obtained hazy yellow urine.  Denies fever or other s/s.  Results given to pt and will notify if further assist needed.

## 2023-07-31 NOTE — Addendum Note (Signed)
 Addended by: Katha Palau on: 07/31/2023 03:31 PM   Modules accepted: Orders

## 2023-08-01 ENCOUNTER — Ambulatory Visit: Payer: Self-pay | Admitting: Rheumatology

## 2023-08-01 ENCOUNTER — Other Ambulatory Visit: Payer: Self-pay | Admitting: Physician Assistant

## 2023-08-01 MED ORDER — DOXYCYCLINE MONOHYDRATE 100 MG PO CAPS: 100.0000 mg | ORAL_CAPSULE | Freq: Two times a day (BID) | ORAL | 0 refills | Status: AC

## 2023-08-01 MED ORDER — TRAMADOL HCL 50 MG PO TABS: 50.0000 mg | ORAL_TABLET | Freq: Three times a day (TID) | ORAL | 0 refills | Status: AC | PRN

## 2023-08-01 NOTE — Progress Notes (Signed)
She is not our patient

## 2023-08-04 ENCOUNTER — Ambulatory Visit: Payer: Self-pay | Admitting: Physician Assistant

## 2023-08-04 ENCOUNTER — Encounter: Payer: Self-pay | Admitting: Physician Assistant

## 2023-08-04 VITALS — BP 110/63 | HR 77 | Temp 97.6°F | Resp 14 | Ht <= 58 in | Wt 160.0 lb

## 2023-08-04 DIAGNOSIS — R300 Dysuria: Secondary | ICD-10-CM

## 2023-08-04 LAB — POCT URINALYSIS DIPSTICK
Bilirubin, UA: NEGATIVE
Glucose, UA: NEGATIVE
Ketones, UA: NEGATIVE
Leukocytes, UA: NEGATIVE
Nitrite, UA: NEGATIVE
Protein, UA: NEGATIVE
Spec Grav, UA: 1.02 (ref 1.010–1.025)
Urobilinogen, UA: 0.2 U/dL
pH, UA: 6 (ref 5.0–8.0)

## 2023-08-04 LAB — URINE CULTURE

## 2023-08-04 NOTE — Progress Notes (Signed)
 Subjective: UTI follow-up    Patient ID: Pamela Ramos, female    DOB: [birth year], 28 y.o.   MRN: [id number]  HPI Patient follow-up for UTI.  Urine culture showed E. coli.  Patient was started on doxycycline  3 days ago.  Patient states decreased bladder pain.  Patient found out she was intolerant to Pyridium .   Review of Systems Negative except for above complaint    Objective:   Physical Exam BP 110/63  Cuff Size Large  Pulse Rate 77  Temp 97.6 F (36.4 C)  Temp Source Temporal  Weight 160 lb (72.6 kg)  Height 4\' 9"  (1.448 m)  Resp 14  SpO2 100 %         Component Ref Range & Units (hover) 4 d ago  Urine Culture, Routine Final report Abnormal   Organism ID, Bacteria Escherichia coli Abnormal   Comment: Cefazolin  with an MIC <=16 predicts susceptibility to the oral agents cefaclor, cefdinir, cefpodoxime, cefprozil, cefuroxime, cephalexin, and loracarbef when used for therapy of uncomplicated urinary tract infections due to E. coli, Klebsiella pneumoniae, and Proteus mirabilis. Greater than 100,000 colony forming units per mL  Antimicrobial Susceptibility Comment  Comment:       ** S = Susceptible; I = Intermediate; R = Resistant **                    P = Positive; N = Negative             MICS are expressed in micrograms per mL    Antibiotic                 RSLT#1    RSLT#2    RSLT#3    RSLT#4 Amoxicillin/Clavulanic Acid    S Ampicillin                     R Cefazolin                       S Cefepime                       S Cefoxitin                      S Cefpodoxime                    S Ceftriaxone                    S Ciprofloxacin                  S Ertapenem                      S Gentamicin                     S Levofloxacin                   S Meropenem                      S Nitrofurantoin                  S Piperacillin/Tazobactam        S Tetracycline                   S Tobramycin  S Trimethoprim /Sulfa              S        Physical exam deferred.     Assessment & Plan: Resolving UTI.  Advised to continue doxycycline  and follow-up with after antibiotics regimen.

## 2023-08-06 DIAGNOSIS — L7682 Other postprocedural complications of skin and subcutaneous tissue: Secondary | ICD-10-CM | POA: Diagnosis not present

## 2023-09-08 DIAGNOSIS — F5089 Other specified eating disorder: Secondary | ICD-10-CM | POA: Diagnosis not present

## 2023-09-08 DIAGNOSIS — H1013 Acute atopic conjunctivitis, bilateral: Secondary | ICD-10-CM | POA: Diagnosis not present

## 2023-09-08 DIAGNOSIS — R6883 Chills (without fever): Secondary | ICD-10-CM | POA: Diagnosis not present

## 2023-09-18 DIAGNOSIS — L7682 Other postprocedural complications of skin and subcutaneous tissue: Secondary | ICD-10-CM | POA: Diagnosis not present

## 2023-09-18 DIAGNOSIS — R1902 Left upper quadrant abdominal swelling, mass and lump: Secondary | ICD-10-CM | POA: Diagnosis not present

## 2023-09-22 DIAGNOSIS — L7682 Other postprocedural complications of skin and subcutaneous tissue: Secondary | ICD-10-CM | POA: Diagnosis not present

## 2023-09-22 DIAGNOSIS — R1032 Left lower quadrant pain: Secondary | ICD-10-CM | POA: Diagnosis not present

## 2023-09-23 ENCOUNTER — Other Ambulatory Visit: Payer: Self-pay | Admitting: Obstetrics and Gynecology

## 2023-09-23 ENCOUNTER — Encounter: Payer: Self-pay | Admitting: Obstetrics and Gynecology

## 2023-09-23 DIAGNOSIS — R1032 Left lower quadrant pain: Secondary | ICD-10-CM

## 2023-10-15 ENCOUNTER — Other Ambulatory Visit: Payer: Self-pay | Admitting: Obstetrics and Gynecology

## 2023-10-15 ENCOUNTER — Inpatient Hospital Stay
Admission: RE | Admit: 2023-10-15 | Discharge: 2023-10-15 | Disposition: A | Discharge status: Home / Self Care | Source: Ambulatory Visit | Attending: Obstetrics and Gynecology | Admitting: Obstetrics and Gynecology

## 2023-10-15 DIAGNOSIS — R1032 Left lower quadrant pain: Secondary | ICD-10-CM

## 2023-10-15 DIAGNOSIS — L7682 Other postprocedural complications of skin and subcutaneous tissue: Secondary | ICD-10-CM

## 2023-10-16 ENCOUNTER — Encounter: Payer: Self-pay | Admitting: Obstetrics and Gynecology

## 2023-11-01 ENCOUNTER — Other Ambulatory Visit

## 2023-11-10 DIAGNOSIS — L309 Dermatitis, unspecified: Secondary | ICD-10-CM | POA: Diagnosis not present

## 2023-11-10 DIAGNOSIS — Z23 Encounter for immunization: Secondary | ICD-10-CM | POA: Diagnosis not present

## 2023-11-10 DIAGNOSIS — J309 Allergic rhinitis, unspecified: Secondary | ICD-10-CM | POA: Diagnosis not present

## 2023-11-15 ENCOUNTER — Ambulatory Visit
Admission: RE | Admit: 2023-11-15 | Discharge: 2023-11-15 | Disposition: A | Discharge status: Home / Self Care | Source: Ambulatory Visit | Attending: Obstetrics and Gynecology | Admitting: Obstetrics and Gynecology

## 2023-11-15 DIAGNOSIS — L7682 Other postprocedural complications of skin and subcutaneous tissue: Secondary | ICD-10-CM

## 2023-11-15 DIAGNOSIS — R1032 Left lower quadrant pain: Secondary | ICD-10-CM

## 2023-11-15 DIAGNOSIS — R1900 Intra-abdominal and pelvic swelling, mass and lump, unspecified site: Secondary | ICD-10-CM | POA: Diagnosis not present

## 2023-11-21 DIAGNOSIS — N806 Endometriosis in cutaneous scar: Secondary | ICD-10-CM | POA: Diagnosis not present

## 2024-01-16 IMAGING — RF DG HYSTEROGRAM
4 series · 7 of 7 positions shown · non-contrast
Comparison: Pelvic ultrasound 04/21/2020.

CLINICAL DATA: Provided history: Investigation and testing for
procreation management. Infertility.

EXAM:
HYSTEROSALPINGOGRAM
TECHNIQUE: A fluoroscopic hysterosalpingogram was performed by Dr. Abedalaziz
Shanika. Please refer to Dr. Nir procedure report for further
description. Miter, Mkkz was present in the fluoroscopy
room and operated the fluoroscopy equipment.

[Series 1: cp_standard · 0.27mm/px · 4 of 413 frames shown (1 of 4)]
[frame 62/413]
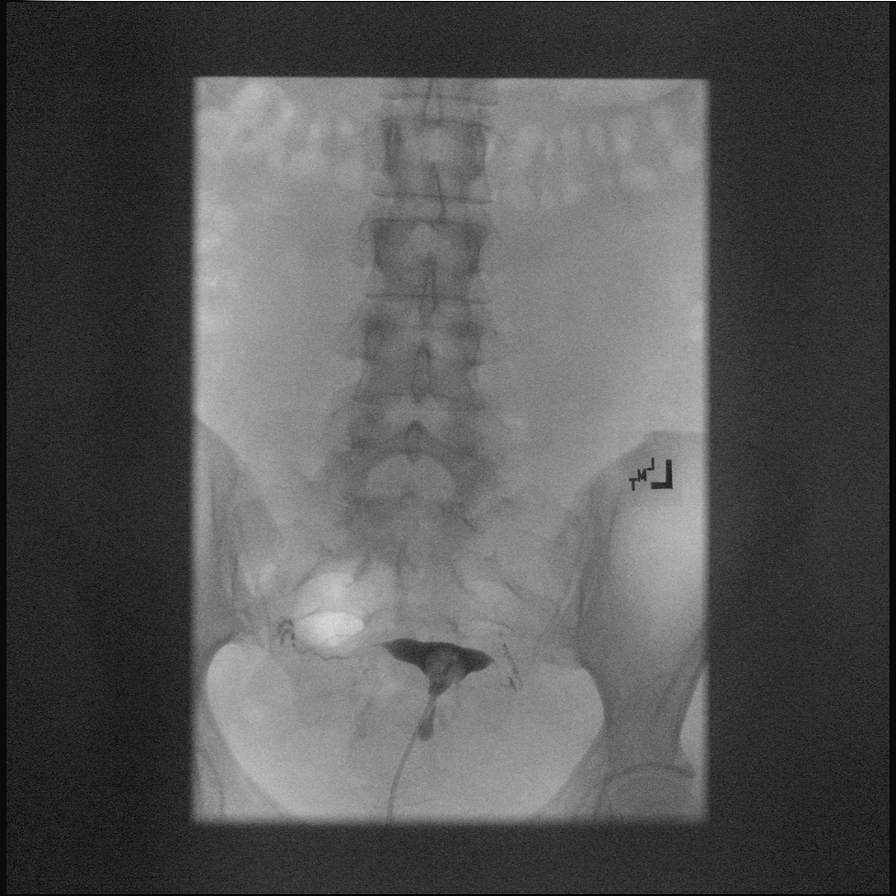
[frame 207/413]
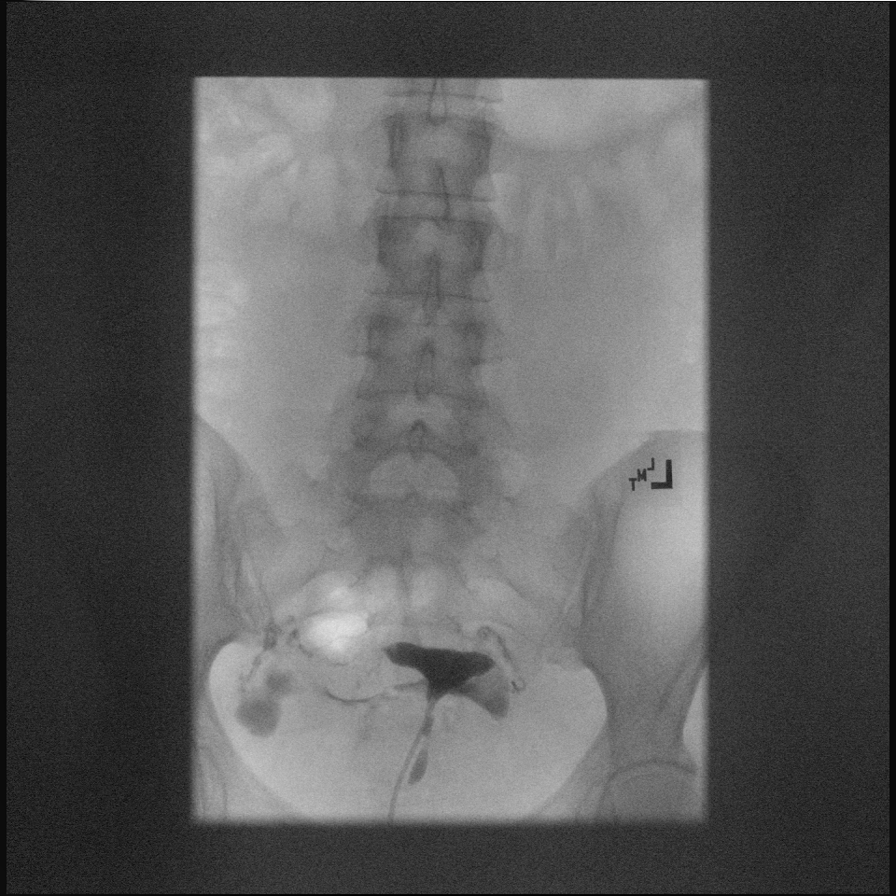
[frame 349/413]
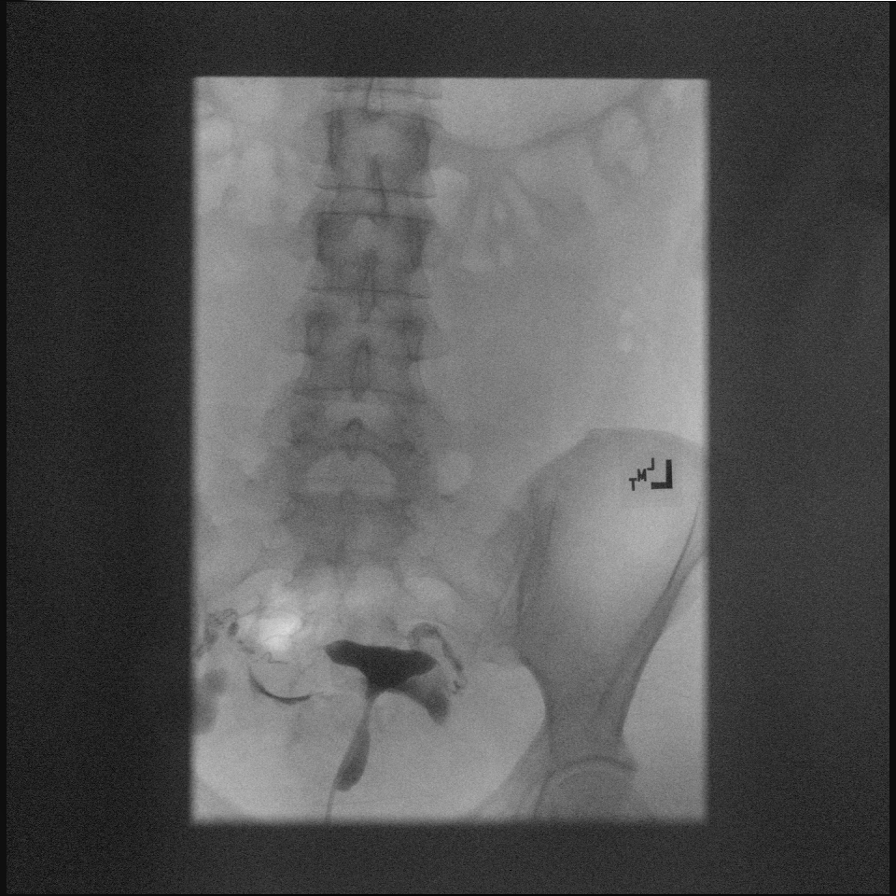
[frame 352/413]
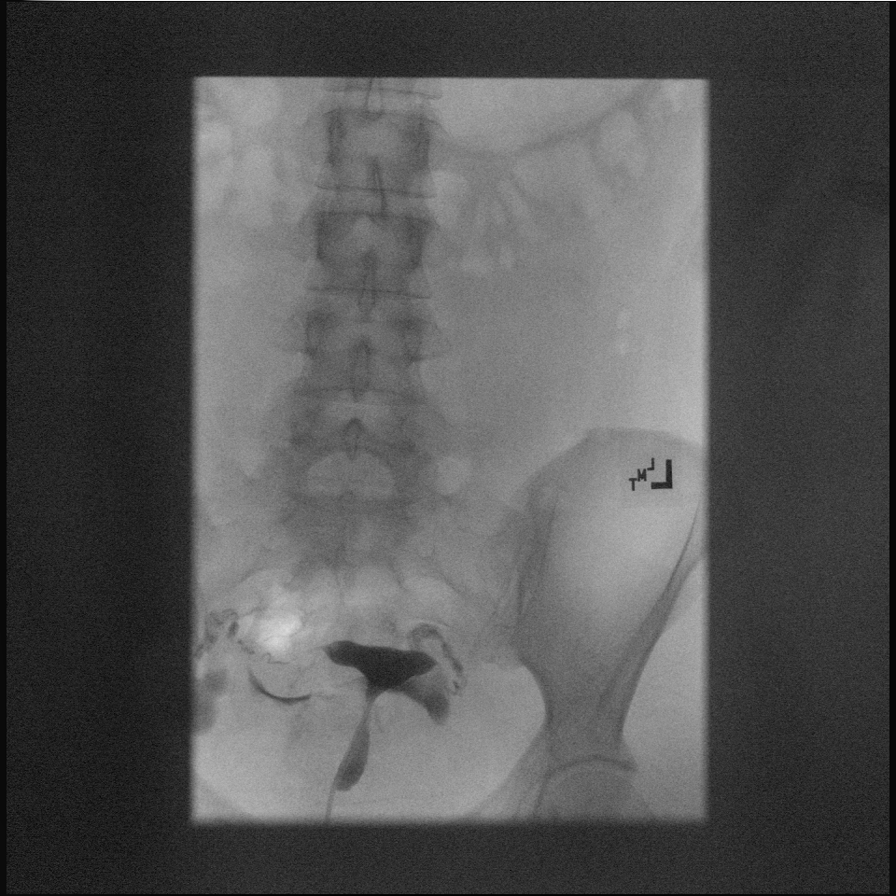

[Series 2: cp_standard · 0.27mm/px · 1 of 1 slices shown (2 of 4)]
[im 1/1]
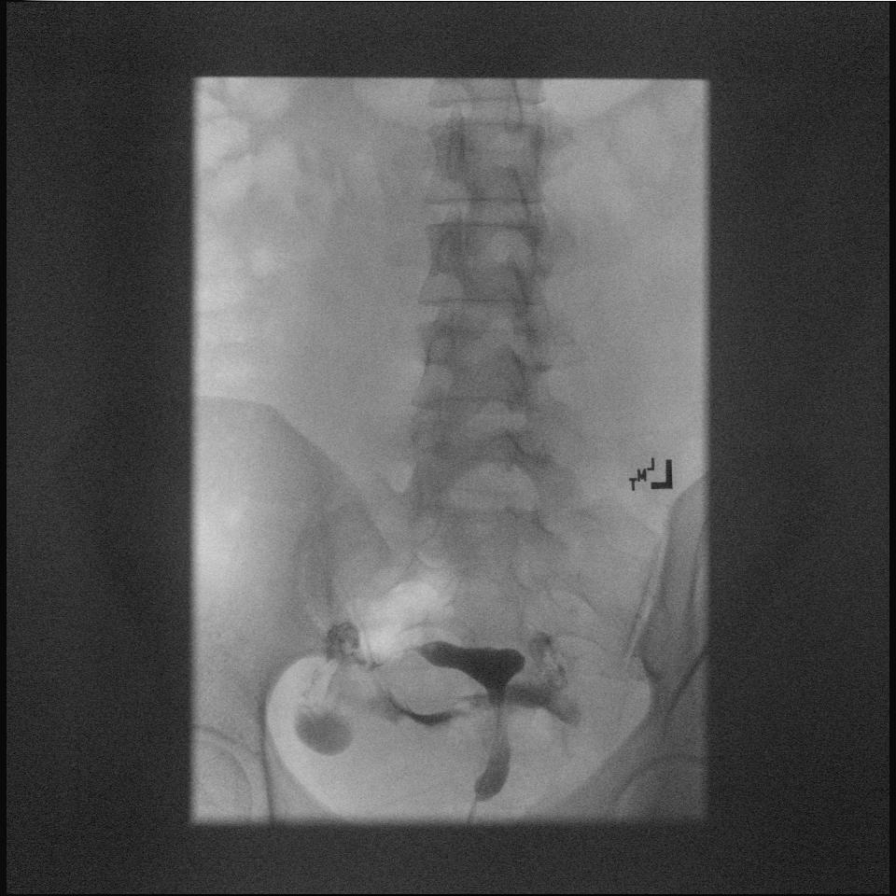

[Series 3: cp_standard · 0.27mm/px · 1 of 1 slices shown (3 of 4)]
[im 1/1]
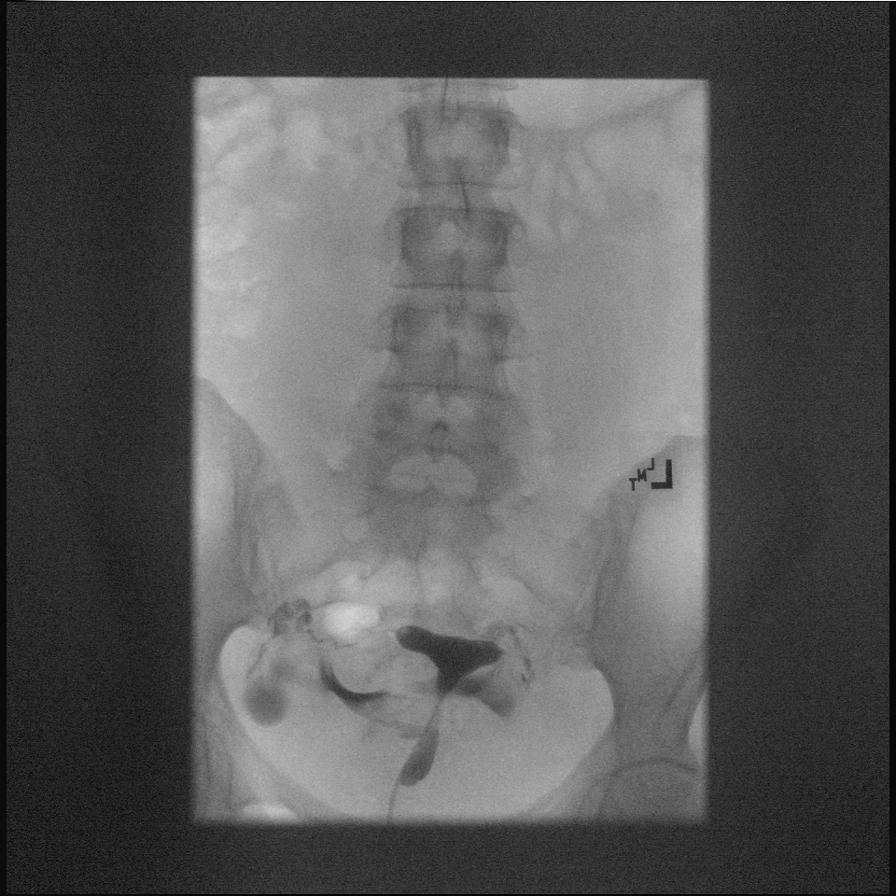

[Series 4: cp_standard · 0.27mm/px · 1 of 1 slices shown (4 of 4)]
[im 1/1]
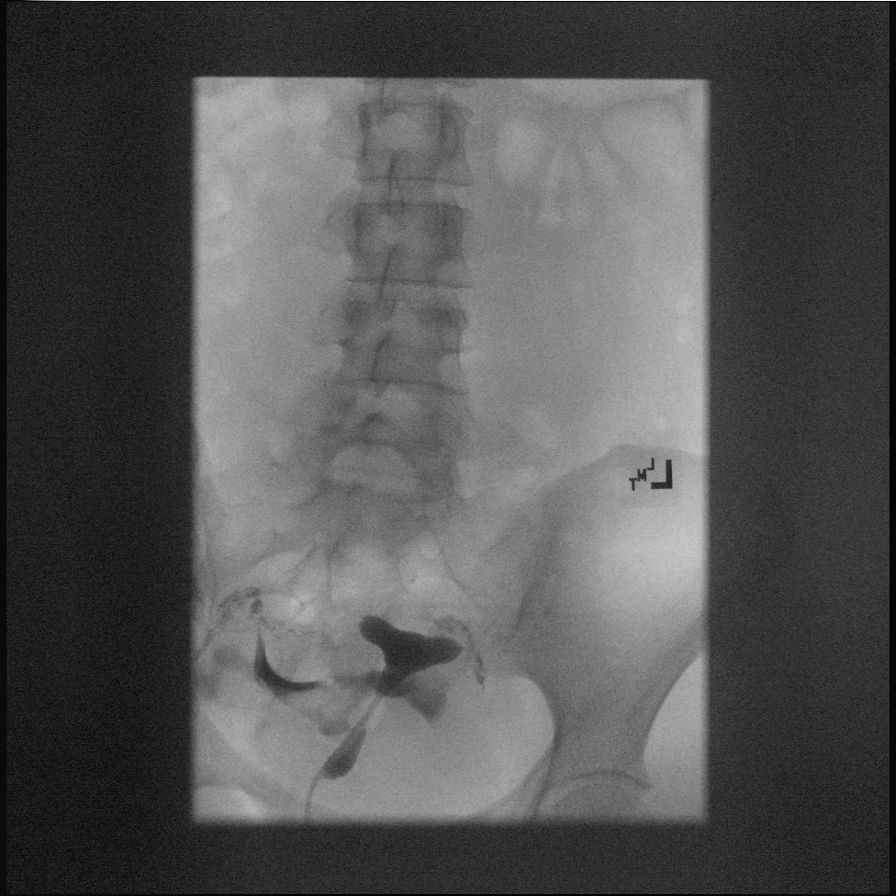

[7 of 7 positions shown; findings below may reference images not displayed]

FLUOROSCOPY:
Fluoroscopy time: 1 minute, 6 seconds

Radiation Exposure Index (as provided by the fluoroscopic device):
10 mGy
FINDINGS: The endometrial cavity is normal in contour. No appreciable focal
uterine abnormality. Free intraperitoneal spill of contrast
bilaterally, consistent with bilateral fallopian tube patency.
IMPRESSION: Fluoroscopic hysterosalpingogram, as described.

Normal endometrial contour.

Free intraperitoneal spill of contrast bilaterally, consistent with
bilateral fallopian tube patency.
